# Patient Record
Sex: Female | Born: 1960 | Race: Black or African American | Hispanic: No | Marital: Single | Smoking: Former smoker
Health system: Southern US, Community
[De-identification: ages and names within clinical notes are randomized; demographics above are authoritative.]

## PROBLEM LIST (undated history)

## (undated) DIAGNOSIS — K219 Gastro-esophageal reflux disease without esophagitis: Secondary | ICD-10-CM

## (undated) DIAGNOSIS — M7660 Achilles tendinitis, unspecified leg: Secondary | ICD-10-CM

## (undated) DIAGNOSIS — E0590 Thyrotoxicosis, unspecified without thyrotoxic crisis or storm: Secondary | ICD-10-CM

## (undated) DIAGNOSIS — D219 Benign neoplasm of connective and other soft tissue, unspecified: Secondary | ICD-10-CM

## (undated) DIAGNOSIS — F909 Attention-deficit hyperactivity disorder, unspecified type: Secondary | ICD-10-CM

## (undated) DIAGNOSIS — E559 Vitamin D deficiency, unspecified: Secondary | ICD-10-CM

## (undated) DIAGNOSIS — F419 Anxiety disorder, unspecified: Secondary | ICD-10-CM

## (undated) DIAGNOSIS — N879 Dysplasia of cervix uteri, unspecified: Secondary | ICD-10-CM

## (undated) DIAGNOSIS — E7800 Pure hypercholesterolemia, unspecified: Secondary | ICD-10-CM

## (undated) HISTORY — DX: Anxiety disorder, unspecified: F41.9

## (undated) HISTORY — DX: Thyrotoxicosis, unspecified without thyrotoxic crisis or storm: E05.90

## (undated) HISTORY — PX: COLPOSCOPY: SHX161

## (undated) HISTORY — PX: KNEE SURGERY: SHX244

## (undated) HISTORY — PX: CERVICAL DISC SURGERY: SHX588

## (undated) HISTORY — PX: PELVIC LAPAROSCOPY: SHX162

## (undated) HISTORY — DX: Pure hypercholesterolemia, unspecified: E78.00

## (undated) HISTORY — DX: Benign neoplasm of connective and other soft tissue, unspecified: D21.9

## (undated) HISTORY — DX: Dysplasia of cervix uteri, unspecified: N87.9

## (undated) HISTORY — DX: Vitamin D deficiency, unspecified: E55.9

## (undated) HISTORY — DX: Achilles tendinitis, unspecified leg: M76.60

## (undated) HISTORY — PX: GYNECOLOGIC CRYOSURGERY: SHX857

---

## 2001-08-17 ENCOUNTER — Other Ambulatory Visit: Admission: RE | Admit: 2001-08-17 | Discharge: 2001-08-17 | Payer: Self-pay | Admitting: Obstetrics and Gynecology

## 2002-09-04 ENCOUNTER — Other Ambulatory Visit: Admission: RE | Admit: 2002-09-04 | Discharge: 2002-09-04 | Payer: Self-pay | Admitting: Obstetrics and Gynecology

## 2003-01-26 ENCOUNTER — Encounter: Admission: RE | Admit: 2003-01-26 | Discharge: 2003-01-26 | Payer: Self-pay | Admitting: Family Medicine

## 2003-01-26 ENCOUNTER — Encounter: Payer: Self-pay | Admitting: Family Medicine

## 2003-02-13 ENCOUNTER — Encounter: Payer: Self-pay | Admitting: Family Medicine

## 2003-02-13 ENCOUNTER — Encounter: Admission: RE | Admit: 2003-02-13 | Discharge: 2003-02-13 | Payer: Self-pay | Admitting: Family Medicine

## 2003-04-22 ENCOUNTER — Inpatient Hospital Stay (HOSPITAL_COMMUNITY): Admission: RE | Admit: 2003-04-22 | Discharge: 2003-04-23 | Payer: Self-pay | Admitting: Neurosurgery

## 2004-02-03 ENCOUNTER — Other Ambulatory Visit: Admission: RE | Admit: 2004-02-03 | Discharge: 2004-02-03 | Payer: Self-pay | Admitting: Obstetrics and Gynecology

## 2005-08-05 ENCOUNTER — Other Ambulatory Visit: Admission: RE | Admit: 2005-08-05 | Discharge: 2005-08-05 | Payer: Self-pay | Admitting: Obstetrics and Gynecology

## 2006-09-20 ENCOUNTER — Other Ambulatory Visit: Admission: RE | Admit: 2006-09-20 | Discharge: 2006-09-20 | Payer: Self-pay | Admitting: Obstetrics and Gynecology

## 2007-05-01 ENCOUNTER — Emergency Department (HOSPITAL_COMMUNITY): Admission: EM | Admit: 2007-05-01 | Discharge: 2007-05-01 | Payer: Self-pay | Admitting: Emergency Medicine

## 2007-06-08 HISTORY — PX: VAGINAL HYSTERECTOMY: SUR661

## 2007-06-16 ENCOUNTER — Ambulatory Visit (HOSPITAL_COMMUNITY): Admission: RE | Admit: 2007-06-16 | Discharge: 2007-06-17 | Payer: Self-pay | Admitting: Obstetrics and Gynecology

## 2007-06-16 ENCOUNTER — Encounter: Payer: Self-pay | Admitting: Obstetrics and Gynecology

## 2008-03-18 ENCOUNTER — Ambulatory Visit: Payer: Self-pay | Admitting: Obstetrics and Gynecology

## 2008-04-02 ENCOUNTER — Ambulatory Visit: Payer: Self-pay | Admitting: Obstetrics and Gynecology

## 2009-03-13 ENCOUNTER — Other Ambulatory Visit: Admission: RE | Admit: 2009-03-13 | Discharge: 2009-03-13 | Payer: Self-pay | Admitting: Obstetrics and Gynecology

## 2009-03-13 ENCOUNTER — Ambulatory Visit: Payer: Self-pay | Admitting: Obstetrics and Gynecology

## 2009-03-13 ENCOUNTER — Encounter: Payer: Self-pay | Admitting: Obstetrics and Gynecology

## 2009-10-28 ENCOUNTER — Emergency Department (HOSPITAL_BASED_OUTPATIENT_CLINIC_OR_DEPARTMENT_OTHER): Admission: EM | Admit: 2009-10-28 | Discharge: 2009-10-28 | Payer: Self-pay | Admitting: Emergency Medicine

## 2009-10-28 ENCOUNTER — Ambulatory Visit: Payer: Self-pay | Admitting: Diagnostic Radiology

## 2010-10-20 NOTE — H&P (Signed)
NAME:  Janet Kelley, Janet Kelley             ACCOUNT NO.:  [id number]   MEDICAL RECORD NO.:  [id number]          PATIENT TYPE:  AMB   LOCATION:  SDC                           FACILITY:  [facility]   PHYSICIAN:  Daniel L. Gottsegen, M.D.DATE OF BIRTH:  Jun 03, 1961   DATE OF ADMISSION:  06/16/2007  DATE OF DISCHARGE:                              HISTORY & PHYSICAL   CHIEF COMPLAINT:  Dysmenorrhea, menorrhagia, multiple myomas including  submucous myoma.   HISTORY OF PRESENT ILLNESS:  The patient is a 50 year old nulligravida  who has been a patient in our office for a long time.  She had  previously been treated by me for endometriosis with laser of her  endometriosis and did get some good results with that.  She then started  to have increasing dysmenorrhea and menorrhagia.  Initially this  responded to a Mirena IUD.  However, it started to become uncontrollable  even with her Mirena IUD.  She would get periods that would last 10  days.  They would be heavy on some of the days.  She would get severe  dysmenorrhea.  She came in in October of this year and underwent  ultrasound.  Ultrasound showed two fibroids, one that was 4 cm and one  that was 2 cm.  She did have a small complex cyst on her left ovary.  Her IUD was removed and saline infusion histogram only showed a  submucous myoma, but the cavity itself was normal without any  intrauterine pathology.  Options discussed with her including uterine  artery embolization, endometrial ablation, but because of the pathology  as well as her history of endometriosis we have elected to proceed with  laparoscopic-assisted vaginal hysterectomy.  The patient is aware of the  fact that it may be difficult to remove her cervix and therefore we plan  to do a supracervical if that is the case so that we will have to make  an incision.  She knows that there is certainly some chance that she  would need an incision anyway, but hopefully that is small.  She has  given  me permission to remove one ovary for significant disease, two  ovaries only for malignancy.  We will treat any minimal endometriosis  seen without removing her ovaries.   PAST MEDICAL HISTORY:  1. History of endometriosis with laser of it in 1995.  2. Surgery for cervical disk November 2004.   PRESENT MEDICATIONS:  Zyrtec for allergies, Celexa, Wellbutrin, Ambien  controlled release and clorazepate.  Please see medical reconciliation  she for the doses.   ALLERGIES:  CECLOR.   Past medical history is otherwise unremarkable.   PAST SURGERIES:  As noted above.   FAMILY HISTORY:  Mother and paternal uncle have had coronary artery  disease.  Mother is diabetic.  Mother and sister have had hypertension.  Paternal grandmother had colon cancer.   SOCIAL HISTORY:  She smokes half a pack of cigarettes a day.  She uses  caffeine.  Other than that is unremarkable.   REVIEW OF SYSTEMS:  HEENT:  Negative except for sinus trouble.  CARDIOVASCULAR:  Negative.  GI: Negative.  GU: Negative.  NEUROLOGICAL/PSYCHIATRIC:  Negative except as noted with her disk  surgery. ALLERGIC/IMMUNOLOGICAL/LYMPHATIC/ENDOCRINE:  Positive as noted  above.   PHYSICAL EXAMINATION:  GENERAL APPEARANCE:  The patient is a well-  developed, well-nourished female in no acute distress.  VITAL SIGNS:  Her blood pressure is 124/74, pulse is 80 and regular,  respirations 16 nonlabored.  She is afebrile.  HEENT:  All within normal limits.  NECK:  Supple.  Trachea in the midline.  Thyroid is not enlarged.  LUNGS:  Clear to P&A.  HEART:  No thrills, heaves or murmurs.  BREASTS:  No masses.  ABDOMEN:  Soft without guarding, rebound or masses.  PELVIC:  External is normal.  BUS is normal.  Vagina is normal.  Cervix  is clean.  Uterus is enlarged by small myomas to a 7 to 8 week size.  Adnexa fails to reveal masses.  Rectovaginal is confirmatory.   ADMISSION IMPRESSION:  Severe menorrhagia, severe dysmenorrhea,  submucous  and another myomas, history of endometriosis.   PLAN:  LAVH with supracervical as back up.      Daniel L. Eda Paschal, M.D.  Electronically Signed     DLG/MEDQ  D:  06/15/2007  T:  06/15/2007  Job:  [id number]

## 2010-10-20 NOTE — Op Note (Signed)
NAME:  Janet Kelley, Janet Kelley             ACCOUNT NO.:  [id number]   MEDICAL RECORD NO.:  [id number]          PATIENT TYPE:  OIB   LOCATION:  9306                          FACILITY:  [facility]   PHYSICIAN:  Daniel L. Gottsegen, M.D.DATE OF BIRTH:  09-12-1960   DATE OF PROCEDURE:  06/16/2007  DATE OF DISCHARGE:                               OPERATIVE REPORT   PREOPERATIVE DIAGNOSES:  1. Dysmenorrhea.  2. Menorrhagia.  3. Fibroids.  4. Endometriosis.   POSTOPERATIVE DIAGNOSES:  1. Dysmenorrhea.  2. Menorrhagia.  3. Fibroids.  4. No endometriosis seen.   OPERATIONS:  Laparoscopic-assisted vaginal hysterectomy.   SURGEON:  Daniel L. Eda Paschal, M.D.   FIRST ASSISTANT:  Miguel Aschoff, M.D.   FINDINGS AT SURGERY:  The patient had multiple fibroids with one very  large subserosal one of about 5 cm.  Total size of the uterus was  slightly over 200 grams.  Ovaries, fallopian tubes and pelvic peritoneum  were all free of disease and no endometriosis was seen.  No pelvic  adhesive disease was seen either.   PROCEDURE:  After adequate general endotracheal anesthesia, the patient  was placed in the dorsal lithotomy position, prepped and draped in the  usual sterile manner.  A Hulka catheter was inserted into the uterus and  a Foley catheter was placed in the bladder.  Using the OptiVu through a  subumbilical midline incision, direct insertion of the trocar and the  laparoscope was placed.  A diagnostic scope was placed with a camera for  magnification.  The peritoneal cavity was entered without trauma.  Two 5  mm ports were placed in the pelvis in the right and left lower quadrant.  The pelvis was visualized and was as noted above.  The round ligaments  were bipolared and cut.  The vesicouterine fold of peritoneum was  sharply dissected free.  The Gyrus was used for all the above.  The  utero-ovarian ligaments and fallopian tubes were bipolared and cut.  The  bladder flap was advanced.  The uterine  arteries were bipolared and cut.  The surgeons then went vaginally.  There was some technical problems  because the patient was nulligravida and also had a very android pelvis.  A 1:200,000 solution of epinephrine was injected around the cervix and  then a 360 degree incision was made around the cervix, bladder and  posterior peritoneum were advanced without trauma.  The posterior cul-de-  sac was entered.  The uterosacral and cardinal ligaments were clamped.  In clamping them, the uterosacrals were shortened and then they were  sutured to the vault laterally for good vault support.  At this point,  the vesicouterine fold of peritoneum could be atraumatically entered.  Another bite was taken on each side.  The uterus was then partially  delivered and with a combination of both morcellation and myomectomy,  the size of the uterus could be reduced enough that the uterus could be  delivered and the balance of the broad ligament bilaterally was clamped,  cut and suture ligated.  The uterus was sent to pathology in pieces for  tissue diagnosis.  Suture  material for all the above-mentioned pedicles  was #1 chromic catgut.  After this was done, copious irrigation was done  with sterile saline.  The vaginal cuff was sutured to the peritoneum  with a running locking 0 Vicryl and then the cuff was closed with figure-  of-eights of #1 chromic catgut eliminating the cul-de-sac to prevent a  enterocele.  The surgeons then regloved, went above.  Copious irrigation  was done, there was no bleeding noted, therefore the procedure was  terminated.  All trocars were removed.  The subumbilical fascial  incision was closed with 0 Vicryl.  The two lower incisions were closed  with 4-0 Monocryl and Dermabond was placed on the subumbilical incision.  Estimated blood loss for the entire procedure was 250 mL with none  replaced.  The patient tolerated procedure well and left the operating  room in satisfactory  condition, draining clear urine from her Foley  catheter.      Daniel L. Eda Paschal, M.D.  Electronically Signed     DLG/MEDQ  D:  06/16/2007  T:  06/16/2007  Job:  [id number]

## 2010-10-20 NOTE — Discharge Summary (Signed)
NAME:  Janet Kelley, Janet Kelley             ACCOUNT NO.:  [id number]   MEDICAL RECORD NO.:  [id number]          PATIENT TYPE:  OIB   LOCATION:  9306                          FACILITY:  [facility]   PHYSICIAN:  Daniel L. Gottsegen, M.D.DATE OF BIRTH:  1960-12-28   DATE OF ADMISSION:  06/16/2007  DATE OF DISCHARGE:  06/17/2007                               DISCHARGE SUMMARY   HISTORY:  The patient is a 50 year old nulligravida who was admitted to  the hospital with fibroids, dysmenorrhea, menorrhagia and pelvic pain  for definitive surgery.  On the day of admission she was taken to the  operating room and a laparoscopic assisted vaginal hysterectomy was done  without difficulty.  She was kept overnight for observation without  being admitted and on the morning of January 10 she was ready for  discharge.  She was voiding well, tolerating normal diet and pain relief  was adequate.   DISCHARGE MEDICATIONS:  Were Tylox for pain relief.  She also has  Darvocet-N 100 which she will reduce to as needed.   DISCHARGE INSTRUCTIONS:  She will increase her diet slowly.  She was  allowed to take steps but she will not drive for 2 weeks or lift for 4  weeks.   FOLLOWUP:  She will return to the office in 3 weeks and will call for an  appointment.  Final pathology report is not available at time of  dictation.   DISCHARGE DIAGNOSIS:  Leiomyomata uteri with pelvic pain, dysmenorrhea  and menorrhagia.   OPERATION:  Laparoscopic assisted vaginal hysterectomy.   CONDITION ON DISCHARGE:  improved      Daniel L. Eda Paschal, M.D.  Electronically Signed     DLG/MEDQ  D:  06/17/2007  T:  06/17/2007  Job:  [id number]

## 2010-10-23 NOTE — Op Note (Signed)
   NAME:  Janet Kelley, Janet Kelley                       ACCOUNT NO.:  [id number]   MEDICAL RECORD NO.:  [id number]                   PATIENT TYPE:  INP   LOCATION:  2887                                 FACILITY:  [facility]   PHYSICIAN:  Hewitt Shorts, Kelley.D.            DATE OF BIRTH:  08/14/1960   DATE OF PROCEDURE:  DATE OF DISCHARGE:                                 OPERATIVE REPORT   NO DICTATION.                                               Hewitt Shorts, Kelley.D.    RWN/MEDQ  D:  04/22/2003  T:  04/22/2003  Job:  [id number]

## 2010-10-23 NOTE — Op Note (Signed)
NAME:  Janet Kelley, Janet Kelley                       ACCOUNT NO.:  [id number]   MEDICAL RECORD NO.:  [id number]                   PATIENT TYPE:  INP   LOCATION:  2887                                 FACILITY:  [facility]   PHYSICIAN:  Hewitt Shorts, Kelley.D.            DATE OF BIRTH:  1960/12/04   DATE OF PROCEDURE:  04/22/2003  DATE OF DISCHARGE:                                 OPERATIVE REPORT   PREOPERATIVE DIAGNOSES:  C4-5 and C5-6 cervical disk herniation, cervical  spondylosis, cervical degenerative disk disease and cervical radiculopathy.   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   PROCEDURE:  C4-5 and C5-6 anterior cervical diskectomy and arthrodesis with  iliac crest allograft and Trinica cervical plating.   SURGEON:  Hewitt Shorts, Kelley.D.   ASSISTANT:  Coletta Memos, Kelley.D.   ANESTHESIA:  General endotracheal.   INDICATIONS FOR PROCEDURE:  The patient is a 50 year old woman who presented  with a left cervical radiculopathy, was found to have a left C5-6 cervical  disk herniation and left C4-5 spiraling spur.  Decision was made to proceed  with anterior cervical diskectomy and arthrodesis.   DESCRIPTION OF PROCEDURE:  The patient was brought to the operating room,  placed under general endotracheal anesthesia.  Patient was placed on 10  pounds of Halter traction.  The neck was prepped with Betadine soap and  solution, draped in a sterile fashion.  A horizontal incision was made in  the left side of the neck.  The line of incision was infiltrated with local  anesthetic with epinephrine.  Incision was deepened through subcutaneous  tissues.  Bipolar cautery and electrocautery were used to maintain  hemostasis.  Dissection was carried down through an avascular plane to the  cervical, mastoid, carotid, jugular laterally and trachea and esophagus  medially, and the  ventral aspect of the vertebral column was identified.  A  localizing x-ray was taken and the C4-5 and C5-6 intervertebral disk space  was identified.   Diskectomy was begun with incision in the annulus and continued with the  micro-curets and pituitary rongeurs.  The caudal edges of the endplates of  the corresponding vertebrae were removed using the micro-curets as well as  the micromax drill.  Then the microscope was draped and brought into the  field to provide instrument magnification, illumination and visualization  and the remainder of the decompression was performed using microdissection  and microsurgical technique.  Spiny overgrowth posteriorly was removed  posteriorly using the Micro-max drill, along with the 2 mm Kerrison punches  and footplate.  Posterior longitudinal ligament was removed.  At each level,  it was thickened no acute distress partially calcified.  The disk herniation  was removed, and we were able to decompress the spinal canal and thecal sac,  as well as the foramina and nerve roots bilaterally at each level.  Once the decompression was completed, hemostasis was established with the  use of Gelfoam soaked in thrombin, and then we proceeded with the  arthrodesis. We selected wedge of tricortical iliac crest allograft.  Each  was shaped to the proper size. We sized the intervertebral disk space with  spacers, and then each was grasped into position in the intervertebral disk  space and countersunk.  We then selected at 42 mm Trinica cervical plate.  This was positioned over the fusion construct and secured to the vertebrae  at C4 and C6 with a pair of 4-plate 2 x 14 mm screws. At C4, we used one 4 x  14 mm screw and one 4.2 x 16 mm screw.  All 6 screws were fully tightened  and the locking system secured. The wound was irrigated with Bacitracin  solution, checked for hemostasis with Valsalva confirmed.  Then we proceeded  with closure. The platysma was closed  with interrupted inverted 2-0 Vicryl  sutures, subcutaneous and subcuticular layered closure with inverted 3-0  Vicryl interrupted Vicryl sutures.  Skin was approximated with Dermabond.  The patient tolerated the procedure well.  Estimated blood loss was 50 cc.  Sponge and needle counts were correct.                                               Hewitt Shorts, Kelley.D.    RWN/MEDQ  D:  04/22/2003  T:  04/22/2003  Job:  [id number]

## 2010-12-17 ENCOUNTER — Encounter (INDEPENDENT_AMBULATORY_CARE_PROVIDER_SITE_OTHER): Payer: 59 | Admitting: Obstetrics and Gynecology

## 2010-12-17 ENCOUNTER — Other Ambulatory Visit (HOSPITAL_COMMUNITY)
Admission: RE | Admit: 2010-12-17 | Discharge: 2010-12-17 | Disposition: A | Discharge status: Home / Self Care | Payer: 59 | Source: Ambulatory Visit | Attending: Obstetrics and Gynecology | Admitting: Obstetrics and Gynecology

## 2010-12-17 ENCOUNTER — Other Ambulatory Visit: Payer: Self-pay | Admitting: Obstetrics and Gynecology

## 2010-12-17 DIAGNOSIS — R82998 Other abnormal findings in urine: Secondary | ICD-10-CM

## 2010-12-17 DIAGNOSIS — Z01419 Encounter for gynecological examination (general) (routine) without abnormal findings: Secondary | ICD-10-CM

## 2010-12-17 DIAGNOSIS — Z124 Encounter for screening for malignant neoplasm of cervix: Secondary | ICD-10-CM | POA: Insufficient documentation

## 2010-12-17 DIAGNOSIS — Z113 Encounter for screening for infections with a predominantly sexual mode of transmission: Secondary | ICD-10-CM

## 2010-12-22 ENCOUNTER — Encounter (INDEPENDENT_AMBULATORY_CARE_PROVIDER_SITE_OTHER): Payer: 59

## 2010-12-22 DIAGNOSIS — Z13820 Encounter for screening for osteoporosis: Secondary | ICD-10-CM

## 2011-02-04 ENCOUNTER — Other Ambulatory Visit: Payer: Self-pay | Admitting: Gastroenterology

## 2011-02-22 ENCOUNTER — Ambulatory Visit: Payer: 59 | Admitting: Gynecology

## 2011-02-23 ENCOUNTER — Encounter: Payer: Self-pay | Admitting: Gynecology

## 2011-02-23 ENCOUNTER — Ambulatory Visit (INDEPENDENT_AMBULATORY_CARE_PROVIDER_SITE_OTHER): Payer: 59 | Admitting: Gynecology

## 2011-02-23 DIAGNOSIS — B373 Candidiasis of vulva and vagina: Secondary | ICD-10-CM

## 2011-02-23 DIAGNOSIS — N898 Other specified noninflammatory disorders of vagina: Secondary | ICD-10-CM

## 2011-02-23 DIAGNOSIS — A5901 Trichomonal vulvovaginitis: Secondary | ICD-10-CM

## 2011-02-23 DIAGNOSIS — Z113 Encounter for screening for infections with a predominantly sexual mode of transmission: Secondary | ICD-10-CM

## 2011-02-23 DIAGNOSIS — B3731 Acute candidiasis of vulva and vagina: Secondary | ICD-10-CM

## 2011-02-23 MED ORDER — FLUCONAZOLE 150 MG PO TABS: 150.0000 mg | ORAL_TABLET | Freq: Once | ORAL | Status: AC

## 2011-02-23 MED ORDER — METRONIDAZOLE 500 MG PO TABS: 500.0000 mg | ORAL_TABLET | Freq: Once | ORAL | Status: AC

## 2011-02-23 NOTE — Progress Notes (Signed)
Patient returns one-week history of vaginal discharge no odor itching or other symptoms. She asked about being screened for STDs although last month she was screened by Dr. Eda Paschal.  Exam Pelvic: External BUS vagina with frothy white discharge KOH wet prep done GC Chlamydia screen of cough and urethra done bimanual without masses or tenderness  Assessment and plan: Wet prep positive for yeast and Trichomonas. We'll treat with Diflucan 150x1 dose Flagyl 500 mg #4 by mouth one time. Discussed need for her partner to be evaluated and treated the STD nature of trichomonas was reviewed. She'll follow up on her GC and Chlamydia screen also.

## 2011-02-25 LAB — CBC
HCT: 28.8 — ABNORMAL LOW
HCT: 36.8
Hemoglobin: 10 — ABNORMAL LOW
Hemoglobin: 12.5
MCHC: 33.9
MCHC: 34.6
MCV: 91.3
MCV: 91.5
Platelets: 259
Platelets: 327
RBC: 3.15 — ABNORMAL LOW
RBC: 4.02
RDW: 13
RDW: 13.4
WBC: 5.5
WBC: 8.1

## 2011-02-25 LAB — HCG, SERUM, QUALITATIVE: Preg, Serum: NEGATIVE

## 2011-02-25 LAB — URINALYSIS, DIPSTICK ONLY
Bilirubin Urine: NEGATIVE
Glucose, UA: NEGATIVE
Hgb urine dipstick: NEGATIVE
Ketones, ur: NEGATIVE
Leukocytes, UA: NEGATIVE
Nitrite: NEGATIVE
Protein, ur: NEGATIVE
Specific Gravity, Urine: 1.015
Urobilinogen, UA: 0.2
pH: 6

## 2011-02-25 LAB — BASIC METABOLIC PANEL
BUN: 7
CO2: 28
Calcium: 9.4
Chloride: 101
Creatinine, Ser: 0.65
GFR calc Af Amer: >60
GFR calc non Af Amer: >60
Glucose, Bld: 151 — ABNORMAL HIGH
Potassium: 3.5
Sodium: 135

## 2011-02-26 ENCOUNTER — Telehealth: Payer: Self-pay | Admitting: *Deleted

## 2011-02-26 NOTE — Telephone Encounter (Signed)
PT CALLED WANTING STD RESULTS. RESULTS GIVEN TO PT.

## 2011-03-16 LAB — POCT I-STAT CREATININE
Creatinine, Ser: 0.9
Operator id: 198171

## 2011-03-16 LAB — POCT CARDIAC MARKERS
CKMB, poc: <1 — ABNORMAL LOW
Myoglobin, poc: 75.4
Operator id: 198171
Troponin i, poc: <0.05

## 2011-03-16 LAB — I-STAT 8, (EC8 V) (CONVERTED LAB)
Acid-base deficit: 3 — ABNORMAL HIGH
BUN: 4 — ABNORMAL LOW
Bicarbonate: 21.9
Chloride: 106
Glucose, Bld: 184 — ABNORMAL HIGH
HCT: 44
Hemoglobin: 15
Operator id: 198171
Potassium: 3.6
Sodium: 136
TCO2: 23
pCO2, Ven: 37.1 — ABNORMAL LOW
pH, Ven: 7.378 — ABNORMAL HIGH

## 2011-03-23 ENCOUNTER — Other Ambulatory Visit: Payer: Self-pay | Admitting: Gastroenterology

## 2011-04-01 ENCOUNTER — Ambulatory Visit
Admission: RE | Admit: 2011-04-01 | Discharge: 2011-04-01 | Disposition: A | Discharge status: Home / Self Care | Payer: 59 | Source: Ambulatory Visit | Attending: Gastroenterology | Admitting: Gastroenterology

## 2011-09-01 ENCOUNTER — Encounter: Payer: Self-pay | Admitting: Cardiovascular Disease

## 2011-09-01 ENCOUNTER — Ambulatory Visit (INDEPENDENT_AMBULATORY_CARE_PROVIDER_SITE_OTHER): Payer: 59 | Admitting: Cardiovascular Disease

## 2011-09-01 VITALS — BP 133/86 | HR 82 | Ht 62.5 in | Wt 180.8 lb

## 2011-09-01 DIAGNOSIS — I498 Other specified cardiac arrhythmias: Secondary | ICD-10-CM

## 2011-09-01 DIAGNOSIS — I471 Supraventricular tachycardia: Secondary | ICD-10-CM | POA: Insufficient documentation

## 2011-09-01 MED ORDER — PROPRANOLOL HCL 10 MG PO TABS: 10.0000 mg | ORAL_TABLET | Freq: Four times a day (QID) | ORAL | Status: AC | PRN

## 2011-09-01 NOTE — Progress Notes (Signed)
    Janet Kelley Date of Birth  11-May-1961 [facility]     [place] Office  [address]    [address]   [address] [place], [place]  [address]    [place], [place]  [address] [phone]  Fax  [phone]  [phone]  Fax [phone]   Problem list: 1. Supraventricular tachycardia 2. Diabetes mellitus 3. Hyperlipidemia  History of Present Illness:  Littie is a 51 year old female with a history of supraventricular tachycardia. I seen her in years past. She has not had any recent palpitations. She presents today to reestablish care.  She's not had any recent episodes of palpitations.  She quit smoking 4 years ago. She exercises on an irregular basis. 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  Current Outpatient Prescriptions  Medication Sig Dispense Refill  . citalopram (CELEXA) 20 MG tablet Take 20 mg by mouth daily.       . clonazePAM (KLONOPIN) 0.5 MG tablet Take 0.5 mg by mouth daily.       . fexofenadine (ALLEGRA) 180 MG tablet Take 180 mg by mouth daily.      Marland Kitchen JANUMET 50-500 MG per tablet Take 1 tablet by mouth daily.       . rosuvastatin (CRESTOR) 20 MG tablet Take 20 mg by mouth daily.         Allergies  Allergen Reactions  . Ceclor (Cefaclor)     Past Medical History  Diagnosis Date  . Endometriosis   . Fibroid     Past Surgical History  Procedure Date  . Vaginal hysterectomy   . Pelvic laparoscopy     History  Smoking status  . Former Smoker  Smokeless tobacco  . Not on file    History  Alcohol Use  . Yes    No family history on file.  Reviw of Systems:  Reviewed in the HPI.  All other systems are negative.  Physical Exam: Blood pressure 133/86, pulse 82, height 5' 2.5" (1.588 m), weight 180 lb 12.8 oz (82.01 kg). General: Well developed, well nourished, in no acute distress.  Head: Normocephalic, atraumatic, sclera non-icteric, mucus membranes are moist,   Neck: Supple. Carotids are 2 + without bruits.  No JVD  Lungs: Clear bilaterally to auscultation.  Heart: regular rate.  normal  S1 S2. No murmurs, gallops or rubs.  Abdomen: Soft, non-tender, non-distended with normal bowel sounds. No hepatomegaly. No rebound/guarding. No masses.  Msk:  Strength and tone are normal  Extremities: No clubbing or cyanosis. No edema.  Distal pedal pulses are 2+ and equal bilaterally.  Neuro: Alert and oriented X 3. Moves all extremities spontaneously.  Psych:  Responds to questions appropriately with a normal affect.  ECG: NSR. Incomplete RBBB.  Assessment / Plan:

## 2011-09-01 NOTE — Patient Instructions (Signed)
Your physician wants you to follow-up in: 1 year or sooner if need, You will receive a reminder letter in the mail two months in advance. If you don't receive a letter, please call our office to schedule the follow-up appointment.   

## 2011-09-01 NOTE — Assessment & Plan Note (Signed)
Janet Kelley has a history of supraventricular tachycardia. She has not had to take any propranolol for quite some time but we will refill her prescription. I'll see her again in one year for followup visit. I have ester, sooner if she has any problems.  I've asked her to work on a good diet and exercise program in an effort to lose weight.

## 2011-12-21 ENCOUNTER — Encounter: Payer: Self-pay | Admitting: Obstetrics and Gynecology

## 2012-01-06 IMAGING — US US ABDOMEN COMPLETE
1 series · 14 of 25 positions shown · non-contrast
Comparison: None.

CLINICAL DATA: LUQ abdominal pain

COMPLETE ABDOMINAL ULTRASOUND

[Series 1: us abdomen complete · 0.37mm/px · 14 of 70 slices shown]
[im 1/70]
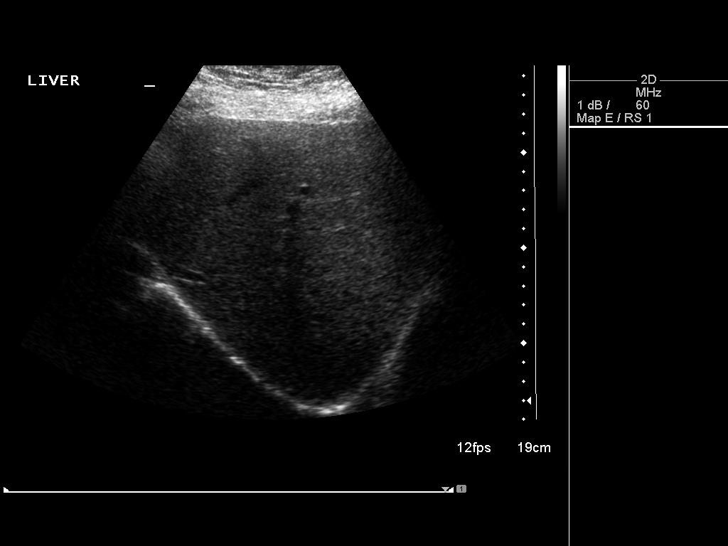
[im 6/70]
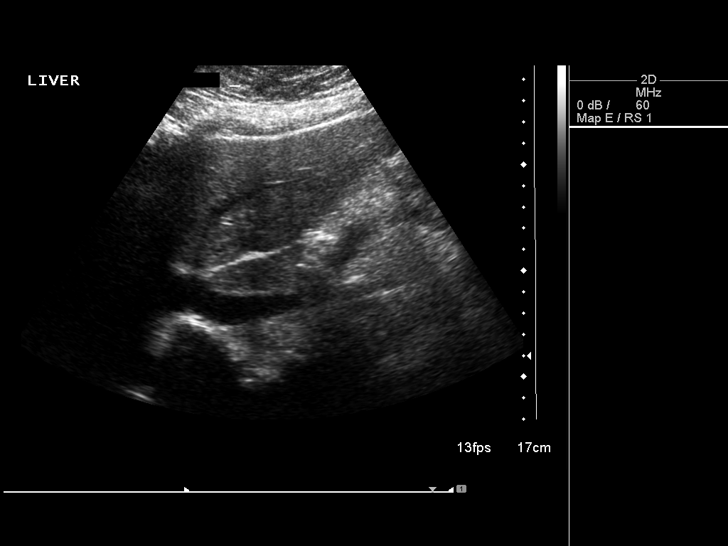
[im 12/70]
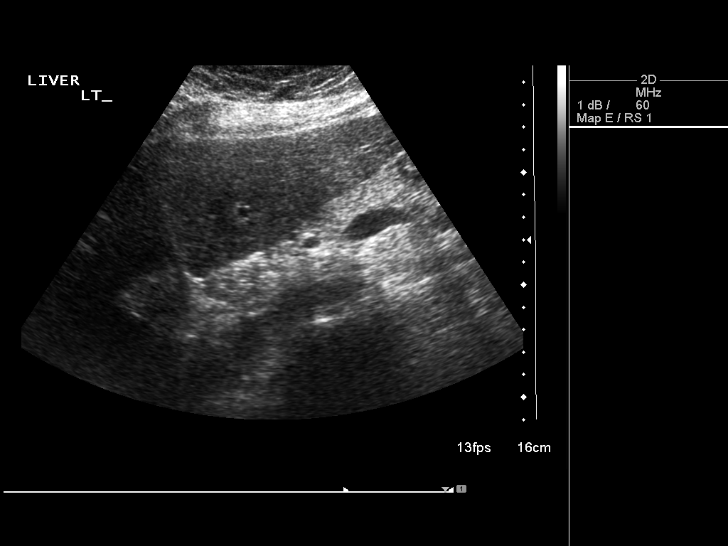
[im 18/70]
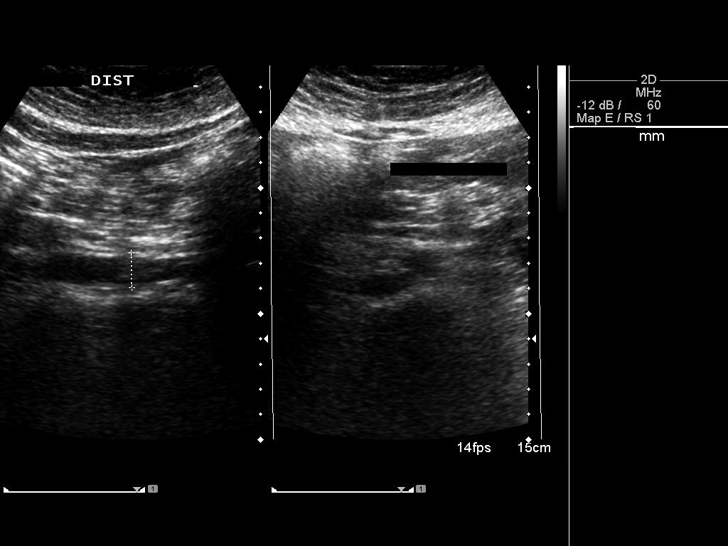
[im 24/70]
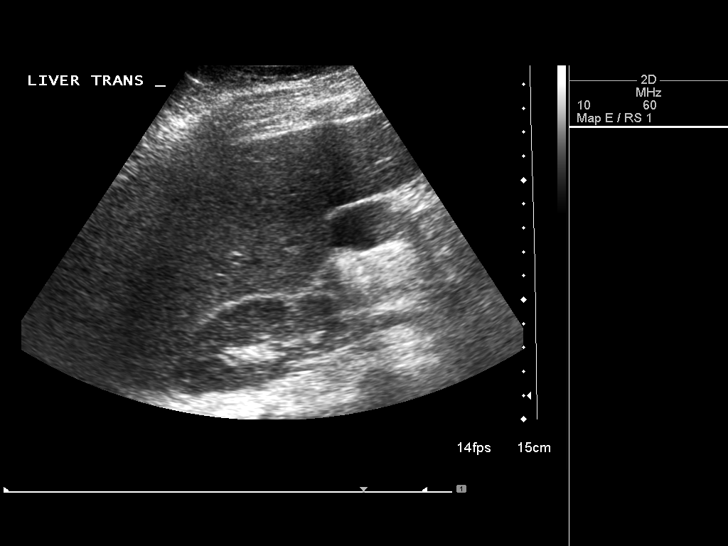
[im 26/70]
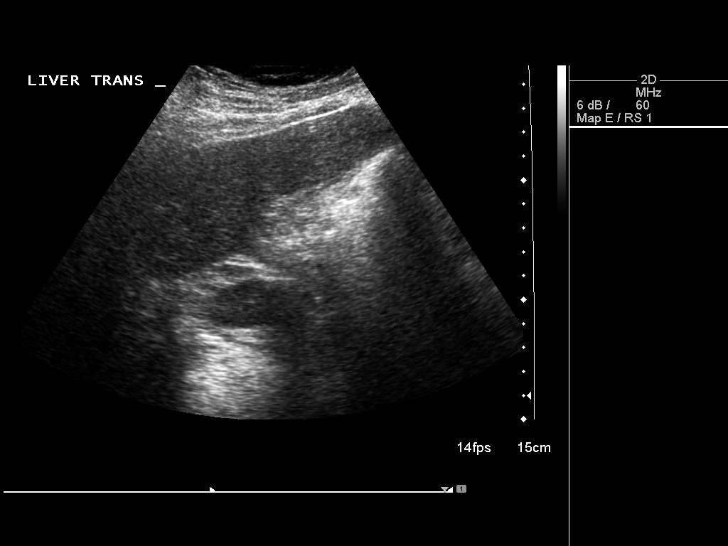
[im 32/70]
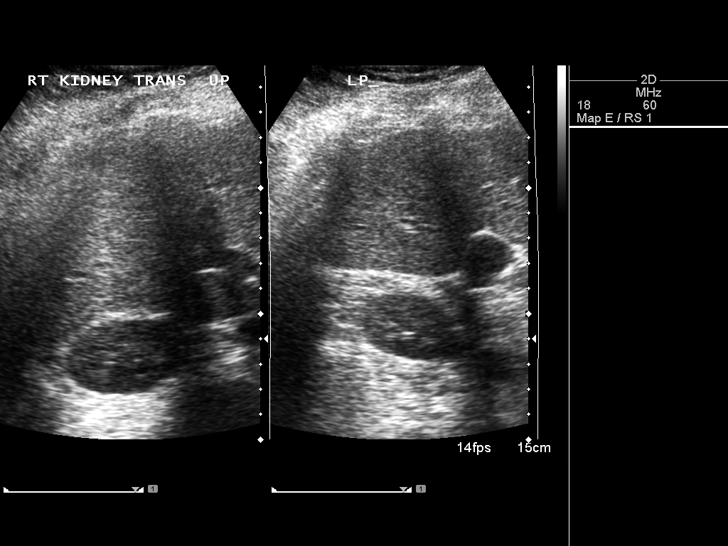
[im 38/70]
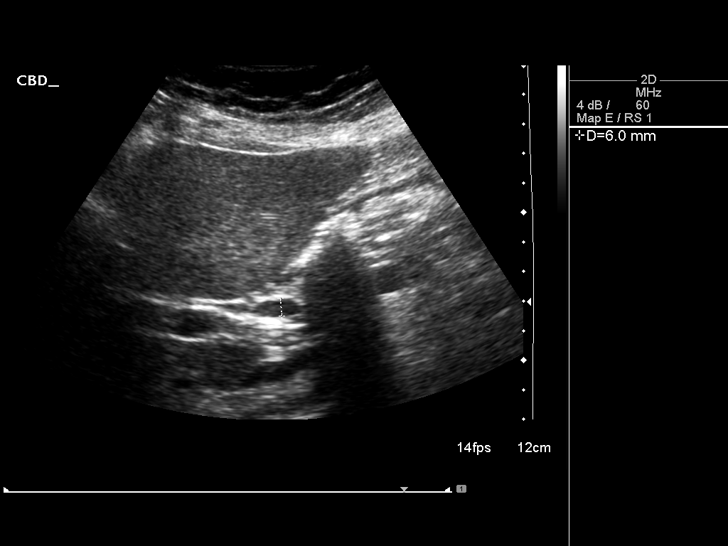
[im 44/70]
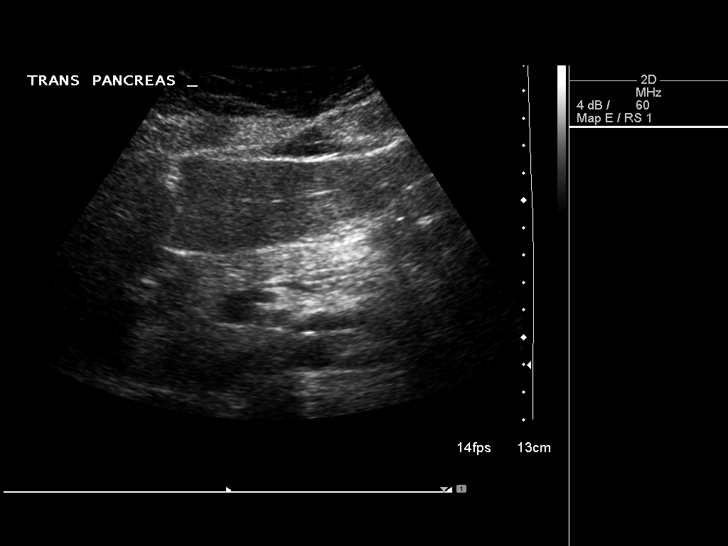
[im 47/70]
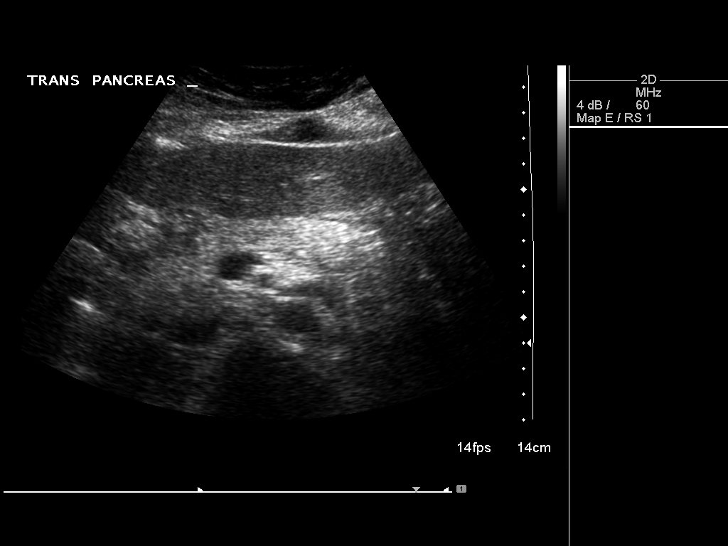
[im 52/70]
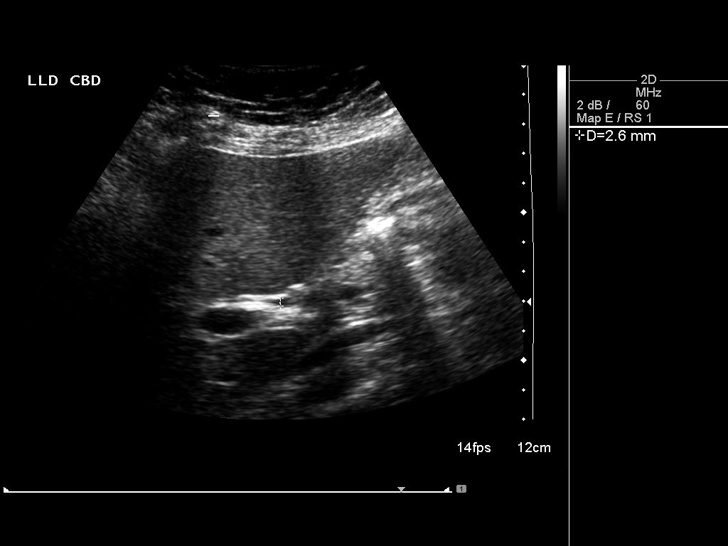
[im 58/70]
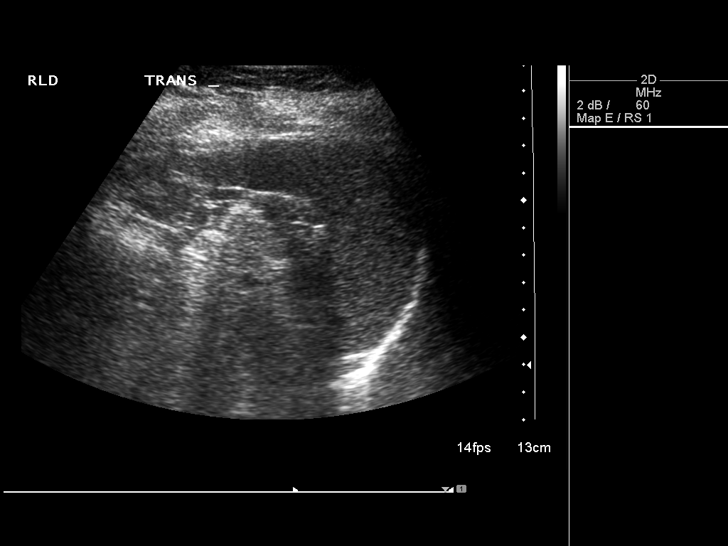
[im 64/70]
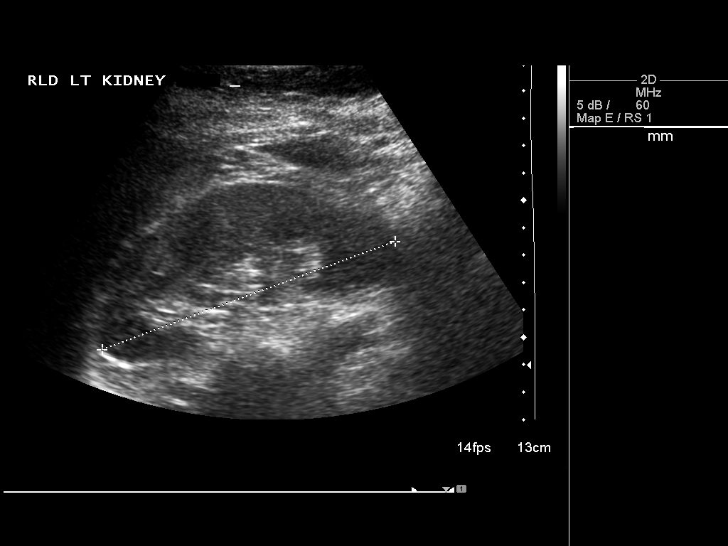
[im 70/70]
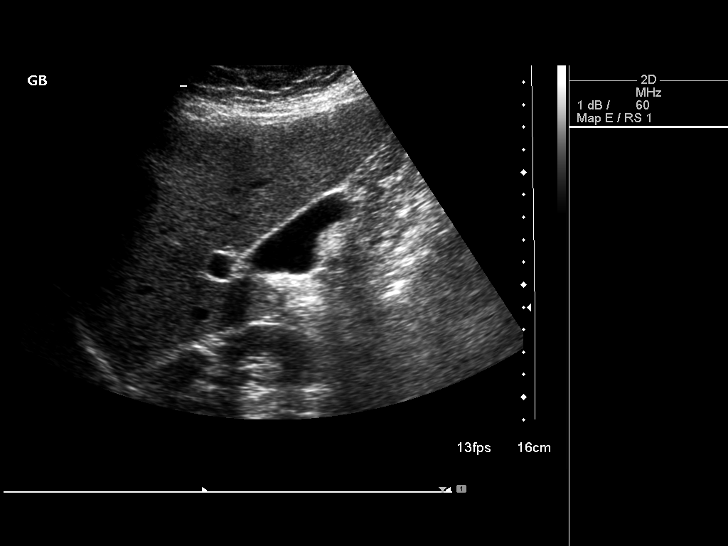

[14 of 25 positions shown; findings below may reference images not displayed]

FINDINGS: Gallbladder:  No gallstones, gallbladder wall thickening, or
pericholecystic fluid.

Common bile duct:  Measures 6 mm.

Liver:  No focal lesion identified.  Within normal limits in
parenchymal echogenicity.

IVC:  Appears normal.

Pancreas:  Incompletely visualized but grossly unremarkable.

Spleen:  Measures 6.9 cm.

Right Kidney:  Measures 9.5 cm.  No mass or hydronephrosis.

Left Kidney:  Measures 11.4 cm.  No mass or hydronephrosis.

Abdominal aorta:  No aneurysm identified.
IMPRESSION: Negative abdominal ultrasound.

## 2012-01-12 ENCOUNTER — Encounter: Payer: Self-pay | Admitting: Gynecology

## 2012-01-12 DIAGNOSIS — D219 Benign neoplasm of connective and other soft tissue, unspecified: Secondary | ICD-10-CM | POA: Insufficient documentation

## 2012-01-12 DIAGNOSIS — N809 Endometriosis, unspecified: Secondary | ICD-10-CM | POA: Insufficient documentation

## 2012-01-25 ENCOUNTER — Encounter: Payer: Self-pay | Admitting: Obstetrics and Gynecology

## 2012-01-25 ENCOUNTER — Ambulatory Visit (INDEPENDENT_AMBULATORY_CARE_PROVIDER_SITE_OTHER): Payer: 59 | Admitting: Obstetrics and Gynecology

## 2012-01-25 VITALS — BP 124/78 | Ht 62.5 in | Wt 183.0 lb

## 2012-01-25 DIAGNOSIS — F419 Anxiety disorder, unspecified: Secondary | ICD-10-CM | POA: Insufficient documentation

## 2012-01-25 DIAGNOSIS — Z01419 Encounter for gynecological examination (general) (routine) without abnormal findings: Secondary | ICD-10-CM

## 2012-01-25 DIAGNOSIS — N879 Dysplasia of cervix uteri, unspecified: Secondary | ICD-10-CM | POA: Insufficient documentation

## 2012-01-25 DIAGNOSIS — E7800 Pure hypercholesterolemia, unspecified: Secondary | ICD-10-CM | POA: Insufficient documentation

## 2012-01-25 DIAGNOSIS — Z113 Encounter for screening for infections with a predominantly sexual mode of transmission: Secondary | ICD-10-CM

## 2012-01-25 LAB — HIV ANTIBODY (ROUTINE TESTING W REFLEX): HIV: NONREACTIVE

## 2012-01-25 LAB — HEPATITIS C ANTIBODY: HCV Ab: NEGATIVE

## 2012-01-25 LAB — RPR

## 2012-01-25 LAB — HEPATITIS B SURFACE ANTIGEN: Hepatitis B Surface Ag: NEGATIVE

## 2012-01-25 MED ORDER — ESTRADIOL 1 MG PO TABS: 1.0000 mg | ORAL_TABLET | Freq: Every day | ORAL | Status: DC

## 2012-01-25 NOTE — Progress Notes (Signed)
Patient came to see me today for her annual GYN exam. In 2010 we told her she was menopausal. She did well until this year when she started having hot flashes that are now intolerable and vaginal dryness. She is having no vaginal bleeding. She is having no pelvic pain. She is status post LAVH done for endometriosis and fibroids. She was treated with cryosurgery for cervical dysplasia but has had normal Pap smears for greater than 20 years. Her last Pap smear was 2012. She is up-to-date on mammograms. She does her lab through PCP. She had normal bone density at our office in 2012. She is not currently sexually active but was a month ago and requested STD testing.  HEENT: Within normal limits. Janet Kelley present. Neck: No masses. Supraclavicular lymph nodes: Not enlarged. Breasts: Examined in both sitting and lying position. Symmetrical without skin changes or masses. Abdomen: Soft no masses guarding or rebound. No hernias. Pelvic: External within normal limits. BUS within normal limits. Vaginal examination shows good estrogen effect, no cystocele enterocele or rectocele. Cervix and uterus absent. Adnexa within normal limits. Rectovaginal confirmatory. Extremities within normal limits.  Assessment: #1. Vasomotor symptoms #2. Endometriosis #3. Previous history of cervical dysplasia with cryosurgery with normal Pap for greater than 20 years. The new Pap smear guidelines were discussed with the patient. No Pap done.  Plan: Discussed HRT in detail. Discussed transdermal estrogen. Patient understands that transdermal is safer for DVT. She elected to do oral estradiol 1 mg daily. Continue yearly mammograms. Patient checked  for STD at her request.

## 2012-01-25 NOTE — Patient Instructions (Signed)
Continue yearly mammograms 

## 2012-01-25 NOTE — Addendum Note (Signed)
Addended by: Dayna Barker on: 01/25/2012 09:39 AM   Modules accepted: Orders

## 2012-01-26 LAB — URINALYSIS W MICROSCOPIC + REFLEX CULTURE
Bacteria, UA: NONE SEEN
Bilirubin Urine: NEGATIVE
Casts: NONE SEEN
Crystals: NONE SEEN
Glucose, UA: 100 mg/dL — AB
Hgb urine dipstick: NEGATIVE
Ketones, ur: NEGATIVE mg/dL
Leukocytes, UA: NEGATIVE
Nitrite: NEGATIVE
Protein, ur: NEGATIVE mg/dL
Specific Gravity, Urine: 1.029 (ref 1.005–1.030)
Urobilinogen, UA: 1 mg/dL (ref 0.0–1.0)
pH: 6 (ref 5.0–8.0)

## 2012-01-26 LAB — GC/CHLAMYDIA PROBE AMP, GENITAL
Chlamydia, DNA Probe: NEGATIVE
GC Probe Amp, Genital: NEGATIVE

## 2012-01-31 ENCOUNTER — Encounter: Payer: Self-pay | Admitting: Obstetrics and Gynecology

## 2013-03-16 ENCOUNTER — Ambulatory Visit (INDEPENDENT_AMBULATORY_CARE_PROVIDER_SITE_OTHER): Payer: 59 | Admitting: Gynecology

## 2013-03-16 ENCOUNTER — Encounter: Payer: Self-pay | Admitting: Gynecology

## 2013-03-16 VITALS — BP 110/76 | Ht 61.0 in | Wt 178.0 lb

## 2013-03-16 DIAGNOSIS — Z01419 Encounter for gynecological examination (general) (routine) without abnormal findings: Secondary | ICD-10-CM

## 2013-03-16 DIAGNOSIS — N951 Menopausal and female climacteric states: Secondary | ICD-10-CM

## 2013-03-16 DIAGNOSIS — Z79890 Hormone replacement therapy (postmenopausal): Secondary | ICD-10-CM

## 2013-03-16 MED ORDER — ESTRADIOL 0.05 MG/24HR TD PTTW: 1.0000 | MEDICATED_PATCH | TRANSDERMAL | Status: DC

## 2013-03-16 NOTE — Patient Instructions (Signed)
Estradiol skin patches What is this medicine? ESTRADIOL (es tra DYE ole) skin patches contain an estrogen. It is mostly used as hormone replacement in menopausal women. It helps to treat hot flashes and prevent osteoporosis. It is also used to treat women with low estrogen levels or those who have had their ovaries removed. This medicine may be used for other purposes; ask your health care provider or pharmacist if you have questions. What should I tell my health care provider before I take this medicine? They need to know if you have any of these conditions: -abnormal vaginal bleeding -blood vessel disease or blood clots -breast, cervical, endometrial, ovarian, liver, or uterine cancer -dementia -diabetes -gallbladder disease -heart disease or recent heart attack -high blood pressure -high cholesterol -high level of calcium in the blood -hysterectomy -kidney disease -liver disease -migraine headaches -protein C deficiency -protein S deficiency -stroke -systemic lupus erythematosus (SLE) -tobacco smoker -an unusual or allergic reaction to estrogens, other hormones, medicines, foods, dyes, or preservatives -pregnant or trying to get pregnant -breast-feeding How should I use this medicine? This medicine is for external use only. Follow the directions on the prescription label. Tear open the pouch, do not use scissors. Remove the stiff protective liner covering the adhesive. Try not to touch the adhesive. Apply the patch, sticky side to the skin, to an area that is clean, dry and hairless. Avoid injured, irritated, calloused, or scarred areas. Do not apply the skin patches to your breasts or around the waistline. Use a different site each time to prevent skin irritation. Do not cut or trim the patch. Do not stop using except on the advice of your doctor or health care professional. Do not wear more than one patch at a time unless you are told to do so by your doctor or health care  professional. Contact your pediatrician regarding the use of this medicine in children. Special care may be needed. A patient package insert for the product will be given with each prescription and refill. Read this sheet carefully each time. The sheet may change frequently. Overdosage: If you think you have taken too much of this medicine contact a poison control center or emergency room at once. NOTE: This medicine is only for you. Do not share this medicine with others. What if I miss a dose? If you miss a dose, apply it as soon as you can. If it is almost time for your next dose, apply only that dose. Do not apply double or extra doses. What may interact with this medicine? Do not take this medicine with any of the following medications: -aromatase inhibitors like aminoglutethimide, anastrozole, exemestane, letrozole, testolactone This medicine may also interact with the following medications: -carbamazepine -certain antibiotics used to treat infections -certain barbiturates used for inducing sleep or treating seizures -grapefruit juice -medicines for fungus infections like itraconazole and ketoconazole -raloxifene or tamoxifen -rifabutin, rifampin, or rifapentine -ritonavir -St. John's Wort This list may not describe all possible interactions. Give your health care provider a list of all the medicines, herbs, non-prescription drugs, or dietary supplements you use. Also tell them if you smoke, drink alcohol, or use illegal drugs. Some items may interact with your medicine. What should I watch for while using this medicine? Visit your doctor or health care professional for regular checks on your progress. You will need a regular breast and pelvic exam and Pap smear while on this medicine. You should also discuss the need for regular mammograms with your health care professional, and  follow his or her guidelines for these tests. This medicine can make your body retain fluid, making your  fingers, hands, or ankles swell. Your blood pressure can go up. Contact your doctor or health care professional if you feel you are retaining fluid. If you have any reason to think you are pregnant, stop taking this medicine right away and contact your doctor or health care professional. Smoking increases the risk of getting a blood clot or having a stroke while you are taking this medicine, especially if you are more than 51 years old. You are strongly advised not to smoke. If you wear contact lenses and notice visual changes, or if the lenses begin to feel uncomfortable, consult your eye doctor or health care professional. This medicine can increase the risk of developing a condition (endometrial hyperplasia) that may lead to cancer of the lining of the uterus. Taking progestins, another hormone drug, with this medicine lowers the risk of developing this condition. Therefore, if your uterus has not been removed (by a hysterectomy), your doctor may prescribe a progestin for you to take together with your estrogen. You should know, however, that taking estrogens with progestins may have additional health risks. You should discuss the use of estrogens and progestins with your health care professional to determine the benefits and risks for you. If you are going to have surgery or an MRI, you may need to stop taking this medicine. Consult your health care professional for advice before you schedule the surgery. You may bathe or participate in other activities while wearing your patch. If the patch pulls loose or falls off, you may reapply it if the patch is sticky enough to stay on the skin. You should reapply the patch in a different area. Use a fresh patch if it will no longer stick. What side effects may I notice from receiving this medicine? Side effects that you should report to your doctor or health care professional as soon as possible: -allergic reactions like skin rash, itching or hives, swelling of  the face, lips, or tongue -breast tissue changes or discharge -changes in vision -chest pain -confusion, trouble speaking or understanding -dark urine -general ill feeling or flu-like symptoms -light-colored stools -nausea, vomiting -pain, swelling, warmth in the leg -right upper belly pain -severe headaches -shortness of breath -sudden numbness or weakness of the face, arm or leg -trouble walking, dizziness, loss of balance or coordination -unusual vaginal bleeding -yellowing of the eyes or skin Side effects that usually do not require medical attention (report to your doctor or health care professional if they continue or are bothersome): -hair loss -increased hunger or thirst -increased urination -symptoms of vaginal infection like itching, irritation or unusual discharge -unusually weak or tired This list may not describe all possible side effects. Call your doctor for medical advice about side effects. You may report side effects to FDA at 1-800-FDA-1088. Where should I keep my medicine? Keep out of the reach of children. Store at room temperature below 30 degrees C (86 degrees F). Do not store any patches that have been removed from their protective pouch. Throw away any unused medicine after the expiration date. Dispose of used patches properly. Since used patches may still contain active hormones, fold the patch in half so that it sticks to itself prior to disposal. NOTE: This sheet is a summary. It may not cover all possible information. If you have questions about this medicine, talk to your doctor, pharmacist, or health care provider.  2013, [employer]. (08/26/2010 9:19:41 AM) Hormone Therapy At menopause, your body begins making less estrogen and progesterone hormones. This causes the body to stop having menstrual periods. This is because estrogen and progesterone hormones control your periods and menstrual cycle. A lack of estrogen may cause symptoms such  as:  Hot flushes (or hot flashes).  Vaginal dryness.  Dry skin.  Loss of sex drive.  Risk of bone loss (osteoporosis). When this happens, you may choose to take hormone therapy to get back the estrogen lost during menopause. When the hormone estrogen is given alone, it is usually referred to as ET (Estrogen Therapy). When the hormone progestin is combined with estrogen, it is generally called HT (Hormone Therapy). This was formerly known as hormone replacement therapy (HRT). Your caregiver can help you make a decision on what will be best for you. The decision to use HT seems to change often as new studies are done. Many studies do not agree on the benefits of hormone replacement therapy. LIKELY BENEFITS OF HT INCLUDE PROTECTION FROM:  Hot Flushes (also called hot flashes) - A hot flush is a sudden feeling of heat that spreads over the face and body. The skin may redden like a blush. It is connected with sweats and sleep disturbance. Women going through menopause may have hot flushes a few times a month or several times per day depending on the woman.  Osteoporosis (bone loss)- Estrogen helps guard against bone loss. After menopause, a woman's bones slowly lose calcium and become weak and brittle. As a result, bones are more likely to break. The hip, wrist, and spine are affected most often. Hormone therapy can help slow bone loss after menopause. Weight bearing exercise and taking calcium with vitamin D also can help prevent bone loss. There are also medications that your caregiver can prescribe that can help prevent osteoporosis.  Vaginal Dryness - Loss of estrogen causes changes in the vagina. Its lining may become thin and dry. These changes can cause pain and bleeding during sexual intercourse. Dryness can also lead to infections. This can cause burning and itching. (Vaginal estrogen treatment can help relieve pain, itching, and dryness.)  Urinary Tract Infections are more common after  menopause because of lack of estrogen. Some women also develop urinary incontinence because of low estrogen levels in the vagina and bladder.  Possible other benefits of estrogen include a positive effect on mood and short-term memory in women. RISKS AND COMPLICATIONS  Using estrogen alone without progesterone causes the lining of the uterus to grow. This increases the risk of lining of the uterus (endometrial) cancer. Your caregiver should give another hormone called progestin if you have a uterus.  Women who take combined (estrogen and progestin) HT appear to have an increased risk of breast cancer. The risk appears to be small, but increases throughout the time that HT is taken.  Combined therapy also makes the breast tissue slightly denser which makes it harder to read mammograms (breast X-rays).  Combined, estrogen and progesterone therapy can be taken together every day, in which case there may be spotting of blood. HT therapy can be taken cyclically in which case you will have menstrual periods. Cyclically means HT is taken for a set amount of days, then not taken, then this process is repeated.  HT may increase the risk of stroke, heart attack, breast cancer and forming blood clots in your leg.  Transdermal estrogen (estrogen that is absorbed through the skin with a patch or a cream) may  have more positive results with:  Cholesterol.  Blood pressure.  Blood clots. Having the following conditions may indicate you should not have HT:  Endometrial cancer.  Liver disease.  Breast cancer.  Heart disease.  History of blood clots.  Stroke. TREATMENT   If you choose to take HT and have a uterus, usually estrogen and progestin are prescribed.  Your caregiver will help you decide the best way to take the medications.  Possible ways to take estrogen include:  Pills.  Patches.  Gels.  Sprays.  Vaginal estrogen cream, rings and tablets.  It is best to take the lowest  dose possible that will help your symptoms and take them for the shortest period of time that you can.  Hormone therapy can help relieve some of the problems (symptoms) that affect women at menopause. Before making a decision about HT, talk to your caregiver about what is best for you. Be well informed and comfortable with your decisions. HOME CARE INSTRUCTIONS   Follow your caregivers advice when taking the medications.  A Pap test is done to screen for cervical cancer.  The first Pap test should be done at age 58.  Between ages 27 and 57, Pap tests are repeated every 2 years.  Beginning at age 43, you are advised to have a Pap test every 3 years as long as your past 3 Pap tests have been normal.  Some women have medical problems that increase the chance of getting cervical cancer. Talk to your caregiver about these problems. It is especially important to talk to your caregiver if a new problem develops soon after your last Pap test. In these cases, your caregiver may recommend more frequent screening and Pap tests.  The above recommendations are the same for women who have or have not gotten the vaccine for HPV (Human Papillomavirus).  If you had a hysterectomy for a problem that was not a cancer or a condition that could lead to cancer, then you no longer need Pap tests. However, even if you no longer need a Pap test, a regular exam is a good idea to make sure no other problems are starting.   If you are between ages 11 and 47, and you have had normal Pap tests going back 10 years, you no longer need Pap tests. However, even if you no longer need a Pap test, a regular exam is a good idea to make sure no other problems are starting.   If you have had past treatment for cervical cancer or a condition that could lead to cancer, you need Pap tests and screening for cancer for at least 20 years after your treatment.  If Pap tests have been discontinued, risk factors (such as a new sexual  partner) need to be re-assessed to determine if screening should be resumed.  Some women may need screenings more often if they are at high risk for cervical cancer.  Get mammograms done as per the advice of your caregiver. SEEK IMMEDIATE MEDICAL CARE IF:  You develop abnormal vaginal bleeding.  You have pain or swelling in your legs, shortness of breath, or chest pain.  You develop dizziness or headaches.  You have lumps or changes in your breasts or armpits.  You have slurred speech.  You develop weakness or numbness of your arms or legs.  You have pain, burning, or bleeding when urinating.  You develop abdominal pain. Document Released: 02/20/2003 Document Revised: 08/16/2011 Document Reviewed: 06/10/2010 [facility] Patient Information 2014 [place], [place].

## 2013-03-16 NOTE — Progress Notes (Signed)
Janet Kelley [birth year] [id number]   History:    52 y.o.  for annual gyn exam with worsening hot flashes irritability and mood swings, and decreased libido. She has been menopausal since 2010.She is having no vaginal bleeding. She is having no pelvic pain. She is status post LAVH done for endometriosis and fibroids. She was treated with cryosurgery for cervical dysplasia but has had normal Pap smears for greater than 20 years. Her last Pap smear was 2012. She does her lab through PCP. She had normal bone density at our office in 2012. She is not currently sexually active. Her last mammogram was in 2013. Patient with prior history of colon polyps. Her last colonoscopy was in 2004. She received her flu vaccine earlier this year. Review of her record dictating she was weighing 183 pounds last year is now weighing 178 pounds.    Past medical history,surgical history, family history and social history were all reviewed and documented in the EPIC chart.  Gynecologic History No LMP recorded. Patient has had a hysterectomy. Contraception: post menopausal status Last Pap: 2012. Results were: normal Last mammogram: 2013. Results were: normal  Obstetric History OB History  Gravida Para Term Preterm AB SAB TAB Ectopic Multiple Living  0                  ROS: A ROS was performed and pertinent positives and negatives are included in the history.  GENERAL: No fevers or chills. HEENT: No change in vision, no earache, sore throat or sinus congestion. NECK: No pain or stiffness. CARDIOVASCULAR: No chest pain or pressure. No palpitations. PULMONARY: No shortness of breath, cough or wheeze. GASTROINTESTINAL: No abdominal pain, nausea, vomiting or diarrhea, melena or bright red blood per rectum. GENITOURINARY: No urinary frequency, urgency, hesitancy or dysuria. MUSCULOSKELETAL: No joint or muscle pain, no back pain, no recent trauma. DERMATOLOGIC: No rash, no itching, no lesions. ENDOCRINE: No polyuria,  polydipsia, no heat or cold intolerance. No recent change in weight. HEMATOLOGICAL: No anemia or easy bruising or bleeding. NEUROLOGIC: No headache, seizures, numbness, tingling or weakness. PSYCHIATRIC: No depression, no loss of interest in normal activity or change in sleep pattern.     Exam: chaperone present  BP 110/76  Ht 5\' 1"  (1.549 m)  Wt 178 lb (80.74 kg)  BMI 33.65 kg/m2  Body mass index is 33.65 kg/(m^2).  General appearance : Well developed well nourished female. No acute distress HEENT: Neck supple, trachea midline, no carotid bruits, no thyroidmegaly Lungs: Clear to auscultation, no rhonchi or wheezes, or rib retractions  Heart: Regular rate and rhythm, no murmurs or gallops Breast:Examined in sitting and supine position were symmetrical in appearance, no palpable masses or tenderness,  no skin retraction, no nipple inversion, no nipple discharge, no skin discoloration, no axillary or supraclavicular lymphadenopathy Abdomen: no palpable masses or tenderness, no rebound or guarding Extremities: no edema or skin discoloration or tenderness  Pelvic:  Bartholin, Urethra, Skene Glands: Within normal limits             Vagina: atrophic changes  Cervix: absence  Uterus Absence  Adnexa  Without masses or tenderness  Anus and perineum  normal   Rectovaginal  normal sphincter tone without palpated masses or tenderness             Hemoccult Card provided     Assessment/Plan:  52 y.o. femalewith worsening vasomotor symptoms. She would like to proceed and start hormone replacement therapy. We went for a detailed discussion of the  risks benefits and pros and cons of formal replacement therapy. We discussed in detail the women's health initiative study. We discussed different routes of administration of hormone replacement therapy. She prefers the patch. She will be placed on Vivelle-Dot 0.05 mg/24-hour twice a week. She was reminded to schedule a mammogram. Her PCP will be doing her  lab work. We discussed importance of calcium and vitamin D for osteoporosis prevention. c menopause and hormone replacement therapy was provided.  Note: This dictation was prepared with  Dragon/digital dictation along withSmart phrase technology. Any transcriptional errors that result from this process are unintentional.   Ok Edwards MD, 6:03 PM 03/16/2013

## 2013-03-20 ENCOUNTER — Encounter: Payer: Self-pay | Admitting: Obstetrics and Gynecology

## 2013-04-02 ENCOUNTER — Other Ambulatory Visit: Payer: 59 | Admitting: Anesthesiology

## 2013-04-02 DIAGNOSIS — Z1211 Encounter for screening for malignant neoplasm of colon: Secondary | ICD-10-CM

## 2013-05-07 DIAGNOSIS — E559 Vitamin D deficiency, unspecified: Secondary | ICD-10-CM

## 2013-05-07 HISTORY — DX: Vitamin D deficiency, unspecified: E55.9

## 2013-05-10 ENCOUNTER — Ambulatory Visit (INDEPENDENT_AMBULATORY_CARE_PROVIDER_SITE_OTHER): Payer: 59

## 2013-05-10 ENCOUNTER — Other Ambulatory Visit: Payer: Self-pay | Admitting: Gynecology

## 2013-05-10 DIAGNOSIS — Z13820 Encounter for screening for osteoporosis: Secondary | ICD-10-CM

## 2013-05-10 DIAGNOSIS — N951 Menopausal and female climacteric states: Secondary | ICD-10-CM

## 2013-05-10 DIAGNOSIS — Z79890 Hormone replacement therapy (postmenopausal): Secondary | ICD-10-CM

## 2013-05-17 ENCOUNTER — Other Ambulatory Visit: Payer: Self-pay | Admitting: *Deleted

## 2013-05-17 ENCOUNTER — Other Ambulatory Visit: Payer: 59

## 2013-05-17 DIAGNOSIS — M898X9 Other specified disorders of bone, unspecified site: Secondary | ICD-10-CM

## 2013-05-18 ENCOUNTER — Encounter: Payer: Self-pay | Admitting: Gynecology

## 2013-05-18 ENCOUNTER — Other Ambulatory Visit: Payer: Self-pay | Admitting: *Deleted

## 2013-05-18 LAB — PTH, INTACT AND CALCIUM: PTH: 70.7 pg/mL (ref 14.0–72.0)

## 2013-05-18 LAB — VITAMIN D 25 HYDROXY (VIT D DEFICIENCY, FRACTURES): Vit D, 25-Hydroxy: 12 ng/mL — ABNORMAL LOW (ref 30–89)

## 2013-05-18 MED ORDER — VITAMIN D (ERGOCALCIFEROL) 1.25 MG (50000 UNIT) PO CAPS: 50000.0000 [IU] | ORAL_CAPSULE | ORAL | Status: DC

## 2013-07-20 ENCOUNTER — Encounter (HOSPITAL_COMMUNITY): Payer: Self-pay | Admitting: Emergency Medicine

## 2013-07-20 ENCOUNTER — Emergency Department (INDEPENDENT_AMBULATORY_CARE_PROVIDER_SITE_OTHER)
Admission: EM | Admit: 2013-07-20 | Discharge: 2013-07-20 | Disposition: A | Discharge status: Home / Self Care | Payer: 59 | Source: Home / Self Care | Attending: Emergency Medicine | Admitting: Emergency Medicine

## 2013-07-20 DIAGNOSIS — K047 Periapical abscess without sinus: Secondary | ICD-10-CM

## 2013-07-20 MED ORDER — OXYCODONE-ACETAMINOPHEN 5-325 MG PO TABS: ORAL_TABLET | ORAL | Status: DC

## 2013-07-20 MED ORDER — IBUPROFEN 800 MG PO TABS
ORAL_TABLET | ORAL | Status: AC
  Filled 2013-07-20: qty 1

## 2013-07-20 MED ORDER — DICLOFENAC SODIUM 75 MG PO TBEC: 75.0000 mg | DELAYED_RELEASE_TABLET | Freq: Two times a day (BID) | ORAL | Status: DC

## 2013-07-20 MED ORDER — IBUPROFEN 800 MG PO TABS
800.0000 mg | ORAL_TABLET | Freq: Once | ORAL | Status: AC
  Administered 2013-07-20: 800 mg via ORAL

## 2013-07-20 MED ORDER — CLINDAMYCIN HCL 300 MG PO CAPS: 300.0000 mg | ORAL_CAPSULE | Freq: Four times a day (QID) | ORAL | Status: DC

## 2013-07-20 NOTE — Discharge Instructions (Signed)
Abscessed Tooth An abscessed tooth is an infection around your tooth. It may be caused by holes or damage to the tooth (cavity) or a dental disease. An abscessed tooth causes mild to very bad pain in and around the tooth. See your dentist right away if you have tooth or gum pain. HOME CARE  Take your medicine as told. Finish it even if you start to feel better.  Do not drive after taking pain medicine.  Rinse your mouth (gargle) often with salt water ( teaspoon salt in 8 ounces of warm water).  Do not apply heat to the outside of your face. GET HELP RIGHT AWAY IF:   You have a temperature by mouth above 102 F (38.9 C), not controlled by medicine.  You have chills and a very bad headache.  You have problems breathing or swallowing.  Your mouth will not open.  You develop puffiness (swelling) on the neck or around the eye.  Your pain is not helped by medicine.  Your pain is getting worse instead of better. MAKE SURE YOU:   Understand these instructions.  Will watch your condition.  Will get help right away if you are not doing well or get worse. Document Released: 11/10/2007 Document Revised: 08/16/2011 Document Reviewed: 09/01/2010 [facility] Patient Information 2014 [place].  Look up the [facility] for free dental clinics. [email or web address].com.asp  Get there early and be prepared to wait. [facility] and [employer] have [employer], advertising schools that provide low cost routine dental care.   Other resources: [facility] [place], [place] [phone]  Patients with Medicaid: [address] [place] [facility] Phone:  [phone]                                                  Phone:  [phone]  If unable to pay or uninsured, contact:  [facility] or [facility]. to become qualified for the adult dental clinic.  No matter what dental problem you have, it will not get better unless you get good dental care.  If the tooth is not taken care of, your symptoms will come back in time and you will be visiting [place] again in the [facility]gent [place] with a bad toothache.  So, see your dentist as soon as possible.  If you don't have a dentist, we can give you a list of dentists.  Sometimes the most cost effective treatment is removal of the tooth.  This can be done very inexpensively through one of the low cost Dance movement psychotherapist such as the facility on [facility] in [place] [phone]).  The downside to this is that you will have one less tooth and this can effect your ability to chew.  Some other things that can be done for a dental infection include the following:   Rinse your mouth out with hot salt water (1/2 tsp of table salt and a pinch of baking soda in 8  oz of hot water).  You can do this every 2 or 3 hours.  Avoid cold foods, beverages, and cold air.  This will make your symptoms worse.  Sleep with your head elevated.  Sleeping flat will cause your gums and oral tissues to swell and make them hurt more.  You can sleep on several pillows.  Even better is to sleep in a recliner with your head higher than your heart.  For mild to moderate pain, you can take Tylenol, ibuprofen, or Aleve.  External application of heat by a heating pad, hot water bottle, or hot wet towel can help with pain and speed healing.  You can do this every 2 to 3 hours. Do not fall asleep on a heating pad since this can cause a burn.

## 2013-07-20 NOTE — ED Notes (Signed)
C/o L upper jaw pain and swelling onset today.

## 2013-07-20 NOTE — ED Provider Notes (Signed)
Chief Complaint   Chief Complaint  Patient presents with  . Oral Swelling    History of Present Illness   Janet Kelley is a 53 year old female who has had a two-day history of pain over her left, upper bicuspid. There is swelling of the gingiva. She denies any injury to the tooth or cracking. It has not been filled. She denies any fever or chills. She has had some headache. She denies any facial pain. There is no swelling or pain in the neck. She able to open and close her mouth fully. It hurts to chew on that side. She has no difficulty swallowing or breathing. No swelling of the tongue or throat.  Review of Systems   Other than as noted above, the patient denies any of the following symptoms: Systemic:  No fever, chills,  Or sweats. ENT:  No headache, ear ache, sore throat, nasal congestion, facial pain, or swelling. Lymphatic:  No adenopathy. Lungs:  No coughing, wheezing or shortness of breath.  [place]   Past medical history, family history, social history, meds, and allergies were reviewed. She is allergic to Ceclor. She is a diabetic and has hypercholesterolemia. Current meds include citalopram, clonazepam, Bydureon, Allegra, Janumet, Crestor, vitamin D2, and estradiol.  Physical Examination     Vital signs:  BP 132/80  Pulse 92  Temp(Src) 99 F (37.2 C) (Oral)  Resp 16  SpO2 96% General:  Alert, oriented, in no distress. ENT:  TMs and canals normal.  Nasal mucosa normal. Mouth exam:  Her teeth are in fairly good repair. There is swelling of the gingiva and pain to palpation around the bicuspid on the left upper dentition. She only has one bicuspid in that area. The other one has presumably been removed, and the teeth have moved. There is no visible decay. No swelling of the tongue or the floor the mouth. The pharynx is clear the airway is widely patent. Neck:  No swelling or adenopathy. Lungs:  Breath sounds clear and equal bilaterally.  No wheezes, rales or  rhonchi. Heart:  Regular rhythm.  No gallops or murmers. Skin:  Clear, warm and dry.   Course in [facility] [place]   She was given ibuprofen 800 mg by mouth.  Assessment   The encounter diagnosis was Dental abscess.  She will need to see a dentist and probably have a root canal done.  Plan   1.  Meds:  The following meds were prescribed:   Discharge Medication List as of 07/20/2013  6:36 PM    START taking these medications   Details  clindamycin (CLEOCIN) 300 MG capsule Take 1 capsule (300 mg total) by mouth 4 (four) times daily., Starting 07/20/2013, Until Discontinued, Normal    diclofenac (VOLTAREN) 75 MG EC tablet Take 1 tablet (75 mg total) by mouth 2 (two) times daily., Starting 07/20/2013, Until Discontinued, Normal    oxyCODONE-acetaminophen (PERCOCET) 5-325 MG per tablet 1 to 2 tablets every 6 hours as needed for pain., Print        2.  Patient Education/Counseling:  The patient was given appropriate handouts, self care instructions, and instructed in symptomatic relief. Suggested sleeping with head of bed elevated and hot salt water mouthwash.   3.  Follow up:  The patient was told to follow up if no better in 3 to 4 days, if becoming worse in any way, and given some red flag symptoms such as difficulty swallowing or breathing which would prompt immediate return.  Follow up with a  dentist as soon as posssible.     Harden Mo, MD 07/20/13 [phone]

## 2013-07-23 ENCOUNTER — Other Ambulatory Visit: Payer: Self-pay | Admitting: *Deleted

## 2013-07-23 DIAGNOSIS — F22 Delusional disorders: Secondary | ICD-10-CM

## 2013-07-23 DIAGNOSIS — E559 Vitamin D deficiency, unspecified: Secondary | ICD-10-CM

## 2013-09-12 ENCOUNTER — Encounter (HOSPITAL_COMMUNITY): Payer: Self-pay | Admitting: Emergency Medicine

## 2013-09-12 ENCOUNTER — Emergency Department (INDEPENDENT_AMBULATORY_CARE_PROVIDER_SITE_OTHER)
Admission: EM | Admit: 2013-09-12 | Discharge: 2013-09-12 | Disposition: A | Discharge status: Home / Self Care | Payer: 59 | Source: Home / Self Care | Attending: Emergency Medicine | Admitting: Emergency Medicine

## 2013-09-12 ENCOUNTER — Emergency Department (INDEPENDENT_AMBULATORY_CARE_PROVIDER_SITE_OTHER): Payer: 59

## 2013-09-12 DIAGNOSIS — S93409A Sprain of unspecified ligament of unspecified ankle, initial encounter: Secondary | ICD-10-CM

## 2013-09-12 DIAGNOSIS — S0990XA Unspecified injury of head, initial encounter: Secondary | ICD-10-CM

## 2013-09-12 DIAGNOSIS — S93609A Unspecified sprain of unspecified foot, initial encounter: Secondary | ICD-10-CM

## 2013-09-12 DIAGNOSIS — W108XXA Fall (on) (from) other stairs and steps, initial encounter: Secondary | ICD-10-CM

## 2013-09-12 DIAGNOSIS — W19XXXA Unspecified fall, initial encounter: Secondary | ICD-10-CM

## 2013-09-12 MED ORDER — OXYCODONE-ACETAMINOPHEN 5-325 MG PO TABS: ORAL_TABLET | ORAL | Status: DC

## 2013-09-12 NOTE — Discharge Instructions (Signed)

## 2013-09-12 NOTE — ED Notes (Signed)
I thought i was on the last step, and had 2 more to go. Injury to right foot last PM

## 2013-09-12 NOTE — ED Provider Notes (Signed)
Chief Complaint   Chief Complaint  Patient presents with  . Foot Pain    History of Present Illness   Janet Kelley is a 53 year old female who was going down some steps when she missed the last 2 steps and slipped and fell, landing on her right side. She twisted her right ankle and foot and has pain above the ankle and the foot. She also hit the right side of her head. There was no loss of consciousness. Today she has a mild headache but nothing severe. She denies any neck or facial pain. She hasn't had no upper extremity, chest, lower back, or abdominal pain. She denies any hip or ankle pain. Her biggest pain right now is in her right ankle and foot. She is able to walk with a limp. It's swollen. It hurts to move. There is no numbness or tingling.  Review of Systems   Other than as noted above, the patient denies any of the following symptoms: ENT:  No headache, facial pain, or bleeding from the nose or ears.  No loose or broken teeth. Neck:  No neck pain or stiffnes. Cardiac:  No chest pain. No palpitations, dizziness, syncope or fainting. GI:  No abdominal pain. No nausea, vomiting, or diarrhea. M-S:  No extremity pain, swelling, bruising, limited ROM, or back pain. Neuro:  No loss of consciousness, seizure activity, dizziness, vertigo, paresthesias, numbness, or weakness.  No difficulty with speech or ambulation.  [place]   Past medical history, family history, social history, meds, and allergies were reviewed.  She is allergic to Ceclor. Current meds include Celexa, Klonopin, estradiol, Bydureon, Allegra, Janumet, Crestor, and vitamin D.  Physical Examination    Vital signs:  BP 148/71  Pulse 97  Temp(Src) 98 F (36.7 C) (Oral)  Resp 12  SpO2 100% General:  Alert, oriented and in no distress. Eye:  PERRL, full EOMs. ENT:  No cranial or facial tenderness to palpation. Neck:  No tenderness to palpation.  Full ROM without pain. Heart:  Regular rhythm.  No extrasystoles,  gallops, or murmers. Lungs:  No chest wall tenderness to palpation. Breath sounds clear and equal bilaterally.  No wheezes, rales or rhonchi. Abdomen:  Non tender. Back:  Non tender to palpation.  Full ROM without pain. Extremities:  She has tenderness to palpation over her lateral malleolus and minimal swelling. The ankle has a full range of motion with pain on dorsiflexion and plantarflexion. There is also some pain to palpation of the dorsum of the foot but no swelling or bruising.  Full ROM of all joints without pain.  Pulses full.  Brisk capillary refill. Neuro:  Alert and oriented times 3.  Cranial nerves intact.  No muscle weakness.  Sensation intact to light touch.  Gait normal. Skin:  No bruising, abrasions, or lacerations.  Radiology   Dg Ankle Complete Right  09/12/2013   CLINICAL DATA:  Status post fall.  Right ankle pain.  EXAM: RIGHT ANKLE - COMPLETE 3+ VIEW  COMPARISON:  None.  FINDINGS: No acute bony or joint abnormality. There is some soft tissue swelling about the lateral malleolus.  IMPRESSION: Lateral soft tissue swelling without underlying fracture.   Electronically Signed   By: Inge Rise M.D.   On: 09/12/2013 09:34   Dg Foot Complete Right  09/12/2013   CLINICAL DATA:  Status post fall last night.  Right foot pain.  EXAM: RIGHT FOOT COMPLETE - 3+ VIEW  COMPARISON:  Plain films right foot 08/18/2012.  FINDINGS: Imaged  bones, joints and soft tissues appear normal.  IMPRESSION: Negative exam.   Electronically Signed   By: Inge Rise M.D.   On: 09/12/2013 09:28    Course in [facility] [place]   She was placed in an ASO brace.  Assessment   The primary encounter diagnosis was Ankle sprain. Diagnoses of Foot sprain, Fall, and Head injury were also pertinent to this visit.  The injury appears mild. She was given head injury precautions. Also given exercises for the ankle. Suggested she followup with Dr. Mardelle Matte if no improvement in 2 weeks.  Plan   1.  Meds:  The  following meds were prescribed:   Discharge Medication List as of 09/12/2013 10:04 AM    START taking these medications   Details  !! oxyCODONE-acetaminophen (PERCOCET) 5-325 MG per tablet 1 to 2 tablets every 6 hours as needed for pain., Print     !! - Potential duplicate medications found. Please discuss with provider.      2.  Patient Education/Counseling:  The patient was given appropriate handouts, self care instructions, and instructed in symptomatic relief.    3.  Follow up:  The patient was told to follow up here if no better in 3 to 4 days, or sooner if becoming worse in any way, and given some red flag symptoms such as increasing pain or new neurological symptoms which would prompt immediate return.  Follow up here if necessary.     Harden Mo, MD 09/12/13 [phone]

## 2013-11-23 ENCOUNTER — Telehealth: Payer: Self-pay | Admitting: *Deleted

## 2013-11-23 NOTE — Telephone Encounter (Signed)
Pt called c/o vaginal irritation with odor x 3 days now, pt requesting mediation. I explained to patient OV best.

## 2013-11-27 ENCOUNTER — Encounter: Payer: Self-pay | Admitting: Gynecology

## 2013-11-27 ENCOUNTER — Ambulatory Visit (INDEPENDENT_AMBULATORY_CARE_PROVIDER_SITE_OTHER): Payer: 59 | Admitting: Gynecology

## 2013-11-27 VITALS — BP 130/78

## 2013-11-27 DIAGNOSIS — A499 Bacterial infection, unspecified: Secondary | ICD-10-CM

## 2013-11-27 DIAGNOSIS — N898 Other specified noninflammatory disorders of vagina: Secondary | ICD-10-CM

## 2013-11-27 DIAGNOSIS — Z113 Encounter for screening for infections with a predominantly sexual mode of transmission: Secondary | ICD-10-CM

## 2013-11-27 DIAGNOSIS — N760 Acute vaginitis: Secondary | ICD-10-CM

## 2013-11-27 DIAGNOSIS — Z8639 Personal history of other endocrine, nutritional and metabolic disease: Secondary | ICD-10-CM

## 2013-11-27 DIAGNOSIS — B9689 Other specified bacterial agents as the cause of diseases classified elsewhere: Secondary | ICD-10-CM

## 2013-11-27 LAB — WET PREP FOR TRICH, YEAST, CLUE
Trich, Wet Prep: NONE SEEN
WBC WET PREP: NONE SEEN
YEAST WET PREP: NONE SEEN

## 2013-11-27 MED ORDER — METRONIDAZOLE 500 MG PO TABS: 500.0000 mg | ORAL_TABLET | Freq: Two times a day (BID) | ORAL | Status: DC

## 2013-11-27 NOTE — Patient Instructions (Addendum)
Vitamin D3 2,000 units one tablet qdaily  Metronidazole tablets or capsules What is this medicine? METRONIDAZOLE (me troe NI da zole) is an antiinfective. It is used to treat certain kinds of bacterial and protozoal infections. It will not work for colds, flu, or other viral infections. This medicine may be used for other purposes; ask your health care provider or pharmacist if you have questions. COMMON BRAND NAME(S): Flagyl What should I tell my health care provider before I take this medicine? They need to know if you have any of these conditions: -anemia or other blood disorders -disease of the nervous system -fungal or yeast infection -if you drink alcohol containing drinks -liver disease -seizures -an unusual or allergic reaction to metronidazole, or other medicines, foods, dyes, or preservatives -pregnant or trying to get pregnant -breast-feeding How should I use this medicine? Take this medicine by mouth with a full glass of water. Follow the directions on the prescription label. Take your medicine at regular intervals. Do not take your medicine more often than directed. Take all of your medicine as directed even if you think you are better. Do not skip doses or stop your medicine early. Talk to your pediatrician regarding the use of this medicine in children. Special care may be needed. Overdosage: If you think you have taken too much of this medicine contact a poison control center or emergency room at once. NOTE: This medicine is only for you. Do not share this medicine with others. What if I miss a dose? If you miss a dose, take it as soon as you can. If it is almost time for your next dose, take only that dose. Do not take double or extra doses. What may interact with this medicine? Do not take this medicine with any of the following medications: -alcohol or any product that contains alcohol -amprenavir oral  solution -cisapride -disulfiram -dofetilide -dronedarone -paclitaxel injection -pimozide -ritonavir oral solution -sertraline oral solution -sulfamethoxazole-trimethoprim injection -thioridazine -ziprasidone This medicine may also interact with the following medications: -birth control pills -cimetidine -lithium -other medicines that prolong the QT interval (cause an abnormal heart rhythm) -phenobarbital -phenytoin -warfarin This list may not describe all possible interactions. Give your health care provider a list of all the medicines, herbs, non-prescription drugs, or dietary supplements you use. Also tell them if you smoke, drink alcohol, or use illegal drugs. Some items may interact with your medicine. What should I watch for while using this medicine? Tell your doctor or health care professional if your symptoms do not improve or if they get worse. You may get drowsy or dizzy. Do not drive, use machinery, or do anything that needs mental alertness until you know how this medicine affects you. Do not stand or sit up quickly, especially if you are an older patient. This reduces the risk of dizzy or fainting spells. Avoid alcoholic drinks while you are taking this medicine and for three days afterward. Alcohol may make you feel dizzy, sick, or flushed. If you are being treated for a sexually transmitted disease, avoid sexual contact until you have finished your treatment. Your sexual partner may also need treatment. What side effects may I notice from receiving this medicine? Side effects that you should report to your doctor or health care professional as soon as possible: -allergic reactions like skin rash or hives, swelling of the face, lips, or tongue -confusion, clumsiness -difficulty speaking -discolored or sore mouth -dizziness -fever, infection -numbness, tingling, pain or weakness in the hands or feet -trouble  passing urine or change in the amount of urine -redness,  blistering, peeling or loosening of the skin, including inside the mouth -seizures -unusually weak or tired -vaginal irritation, dryness, or discharge Side effects that usually do not require medical attention (report to your doctor or health care professional if they continue or are bothersome): -diarrhea -headache -irritability -metallic taste -nausea -stomach pain or cramps -trouble sleeping This list may not describe all possible side effects. Call your doctor for medical advice about side effects. You may report side effects to FDA at 1-800-FDA-1088. Where should I keep my medicine? Keep out of the reach of children. Store at room temperature below 25 degrees C (77 degrees F). Protect from light. Keep container tightly closed. Throw away any unused medicine after the expiration date. NOTE: This sheet is a summary. It may not cover all possible information. If you have questions about this medicine, talk to your doctor, pharmacist, or health care provider.  2015, Elsevier/Gold Standard. (2012-12-29 14:08:39) Bacterial Vaginosis Bacterial vaginosis is a vaginal infection that occurs when the normal balance of bacteria in the vagina is disrupted. It results from an overgrowth of certain bacteria. This is the most common vaginal infection in women of childbearing age. Treatment is important to prevent complications, especially in pregnant women, as it can cause a premature delivery. CAUSES  Bacterial vaginosis is caused by an increase in harmful bacteria that are normally present in smaller amounts in the vagina. Several different kinds of bacteria can cause bacterial vaginosis. However, the reason that the condition develops is not fully understood. RISK FACTORS Certain activities or behaviors can put you at an increased risk of developing bacterial vaginosis, including:  Having a new sex partner or multiple sex partners.  Douching.  Using an intrauterine device (IUD) for  contraception. Women do not get bacterial vaginosis from toilet seats, bedding, swimming pools, or contact with objects around them. SIGNS AND SYMPTOMS  Some women with bacterial vaginosis have no signs or symptoms. Common symptoms include:  Grey vaginal discharge.  A fishlike odor with discharge, especially after sexual intercourse.  Itching or burning of the vagina and vulva.  Burning or pain with urination. DIAGNOSIS  Your health care provider will take a medical history and examine the vagina for signs of bacterial vaginosis. A sample of vaginal fluid may be taken. Your health care provider will look at this sample under a microscope to check for bacteria and abnormal cells. A vaginal pH test may also be done.  TREATMENT  Bacterial vaginosis may be treated with antibiotic medicines. These may be given in the form of a pill or a vaginal cream. A second round of antibiotics may be prescribed if the condition comes back after treatment.  HOME CARE INSTRUCTIONS   Only take over-the-counter or prescription medicines as directed by your health care provider.  If antibiotic medicine was prescribed, take it as directed. Make sure you finish it even if you start to feel better.  Do not have sex until treatment is completed.  Tell all sexual partners that you have a vaginal infection. They should see their health care provider and be treated if they have problems, such as a mild rash or itching.  Practice safe sex by using condoms and only having one sex partner. SEEK MEDICAL CARE IF:   Your symptoms are not improving after 3 days of treatment.  You have increased discharge or pain.  You have a fever. MAKE SURE YOU:   Understand these instructions.  Will  watch your condition.  Will get help right away if you are not doing well or get worse. FOR MORE INFORMATION  Centers for Disease Control and Prevention, Division of STD Prevention: [employer]raud[employer] American Sexual Health  Association (ASHA): www.ashastd.org  Document Released: 05/24/2005 Document Revised: 03/14/2013 Document Reviewed: 01/03/2013 [facility] Patient Information 2015 [place], [place]. This information is not intended to replace advice given to you by your health care provider. Make sure you discuss any questions you have with your health care provider.

## 2013-11-27 NOTE — Progress Notes (Signed)
   Patient presented to the office today complaining of vaginal discharge and wanted to an STD screen she has a new partner. She has been using condoms. Patient with past history of having had a hysterectomy. Patient stated that she had an odor with some pruritus as well. Review of patient's rectum indicated last year she had vitamin D deficiency and was started on vitamin D 50,000 units q. weekly for 12 weeks and she did not return to the office for vitamin D level. She's currently not taking vitamin D supplement as had been recommended.  Exam: Bartholin urethra Skene was within normal limits Vagina: Ammonia like odor but no true discharge seen.  Wet prep:  Amine positive for Includes cells moderate Bacteria too numerous to count  Assessment/plan: Clinical evidence of bacterial vaginosis. Patient will be started on Flagyl 500 mg twice a day for 7 days. GC and chlamydia culture was obtained today results pending at time of this dictation. Patient will stop by the lab we'll check HIV, RPR, hepatitis B and C. to complete the STD screen. Patient was vitamin D deficiency history treated for 12 weeks but did not return for followup. We will check her vitamin D level today. I have encouraged her to begin taking vitamin D 3 2000 units one tablet daily.

## 2013-11-28 ENCOUNTER — Other Ambulatory Visit: Payer: Self-pay | Admitting: Gynecology

## 2013-11-28 DIAGNOSIS — E559 Vitamin D deficiency, unspecified: Secondary | ICD-10-CM

## 2013-11-28 LAB — VITAMIN D 25 HYDROXY (VIT D DEFICIENCY, FRACTURES): VIT D 25 HYDROXY: 23 ng/mL — AB (ref 30–89)

## 2013-11-28 LAB — HEPATITIS C ANTIBODY: HCV Ab: NEGATIVE

## 2013-11-28 LAB — GC/CHLAMYDIA PROBE AMP
CT Probe RNA: NEGATIVE
GC PROBE AMP APTIMA: NEGATIVE

## 2013-11-28 LAB — RPR

## 2013-11-28 LAB — HEPATITIS B SURFACE ANTIGEN: Hepatitis B Surface Ag: NEGATIVE

## 2013-11-28 LAB — HIV ANTIBODY (ROUTINE TESTING W REFLEX): HIV 1&2 Ab, 4th Generation: NONREACTIVE

## 2013-11-28 MED ORDER — VITAMIN D (ERGOCALCIFEROL) 1.25 MG (50000 UNIT) PO CAPS: 50000.0000 [IU] | ORAL_CAPSULE | ORAL | Status: DC

## 2013-12-05 ENCOUNTER — Encounter (HOSPITAL_COMMUNITY): Payer: Self-pay | Admitting: Emergency Medicine

## 2013-12-05 ENCOUNTER — Emergency Department (HOSPITAL_COMMUNITY): Payer: 59

## 2013-12-05 ENCOUNTER — Emergency Department (HOSPITAL_COMMUNITY)
Admission: EM | Admit: 2013-12-05 | Discharge: 2013-12-05 | Disposition: A | Discharge status: Nursing Home (Includes ICF) | Payer: 59 | Source: Home / Self Care | Attending: Family Medicine | Admitting: Family Medicine

## 2013-12-05 ENCOUNTER — Emergency Department (HOSPITAL_COMMUNITY)
Admission: EM | Admit: 2013-12-05 | Discharge: 2013-12-06 | Disposition: A | Discharge status: Home / Self Care | Payer: 59 | Attending: Emergency Medicine | Admitting: Emergency Medicine

## 2013-12-05 DIAGNOSIS — E7800 Pure hypercholesterolemia, unspecified: Secondary | ICD-10-CM | POA: Insufficient documentation

## 2013-12-05 DIAGNOSIS — K226 Gastro-esophageal laceration-hemorrhage syndrome: Secondary | ICD-10-CM | POA: Insufficient documentation

## 2013-12-05 DIAGNOSIS — D259 Leiomyoma of uterus, unspecified: Secondary | ICD-10-CM | POA: Insufficient documentation

## 2013-12-05 DIAGNOSIS — K920 Hematemesis: Secondary | ICD-10-CM

## 2013-12-05 DIAGNOSIS — Z79899 Other long term (current) drug therapy: Secondary | ICD-10-CM | POA: Insufficient documentation

## 2013-12-05 DIAGNOSIS — Z87891 Personal history of nicotine dependence: Secondary | ICD-10-CM | POA: Insufficient documentation

## 2013-12-05 DIAGNOSIS — N8000 Endometriosis of the uterus, unspecified: Secondary | ICD-10-CM | POA: Insufficient documentation

## 2013-12-05 DIAGNOSIS — N879 Dysplasia of cervix uteri, unspecified: Secondary | ICD-10-CM | POA: Insufficient documentation

## 2013-12-05 DIAGNOSIS — E119 Type 2 diabetes mellitus without complications: Secondary | ICD-10-CM | POA: Insufficient documentation

## 2013-12-05 DIAGNOSIS — E559 Vitamin D deficiency, unspecified: Secondary | ICD-10-CM | POA: Insufficient documentation

## 2013-12-05 DIAGNOSIS — Z881 Allergy status to other antibiotic agents status: Secondary | ICD-10-CM | POA: Insufficient documentation

## 2013-12-05 MED ORDER — GI COCKTAIL ~~LOC~~
30.0000 mL | Freq: Once | ORAL | Status: AC
  Administered 2013-12-06: 30 mL via ORAL
  Filled 2013-12-05: qty 30

## 2013-12-05 NOTE — ED Notes (Signed)
MD horton at bedside.

## 2013-12-05 NOTE — ED Notes (Signed)
C/o vomiting onset this AM, as she kept retching she started vomiting bright red blood-large amount.  C/o sore throat.  Gargled with warm salt water.  She took a Vicodin and woke up and felt like something in her throat

## 2013-12-05 NOTE — ED Notes (Addendum)
Pt in from urgent care, went there due to one episode of vomiting that occurred this morning, states she vomited and saw blood in vomit, c/o sore throat since that time, sent here for evaluation to r/o mallory -weiss tear. Pt denies further vomiting, denies coughing up any blood.

## 2013-12-05 NOTE — ED Provider Notes (Signed)
CSN: [id number]     Arrival date & time 12/05/13  1924 History   First MD Initiated Contact with Patient 12/05/13 2001     Chief Complaint  Patient presents with  . Hematemesis  . Sore Throat   (Consider location/radiation/quality/duration/timing/severity/associated sxs/prior Treatment) Patient is a 53 y.o. female presenting with pharyngitis. The history is provided by the patient.  Sore Throat This is a new problem. The current episode started 6 to 12 hours ago (vomited x1 this am but retching produce a lot of blood according to pt, none since but continues with pain in throat with swallowing, no abd pain.). The problem has been gradually improving. Pertinent negatives include no abdominal pain. The symptoms are aggravated by swallowing.    Past Medical History  Diagnosis Date  . Diabetes mellitus     Type 2  . Endometriosis   . Fibroid   . Elevated cholesterol   . Anxiety   . Cervical dysplasia   . Vitamin D deficiency December 2014   Past Surgical History  Procedure Laterality Date  . Pelvic laparoscopy      DL laser endometriosis  . Vaginal hysterectomy  2009    [facility]  . Cervical disc surgery    . Colposcopy    . Gynecologic cryosurgery    . Knee surgery      Arthroscopic   Family History  Problem Relation Age of Onset  . Hypertension Mother   . Heart disease Mother   . Diabetes Mother   . Cancer Mother     throat cancer  . Hypertension Sister   . Heart disease Paternal Uncle   . Colon cancer Paternal Grandmother   . Colon cancer Cousin     STOMACH   History  Substance Use Topics  . Smoking status: Former Smoker    Types: Cigarettes    Quit date: 03/16/2006  . Smokeless tobacco: Not on file  . Alcohol Use: 1.0 oz/week    2 drink(s) per week     Comment: occasiaonal   OB History   Grav Para Term Preterm Abortions TAB SAB Ect Mult Living   0              Review of Systems  Constitutional: Negative.   HENT: Positive for trouble swallowing.    Cardiovascular: Negative.   Gastrointestinal: Positive for vomiting. Negative for nausea, abdominal pain, diarrhea and constipation.    Allergies  Ceclor  Home Medications   Prior to Admission medications   Medication Sig Start Date End Date Taking? Authorizing Provider  citalopram (CELEXA) 20 MG tablet Take 20 mg by mouth daily.  08/16/11   Historical Provider, MD  clindamycin (CLEOCIN) 300 MG capsule Take 1 capsule (300 mg total) by mouth 4 (four) times daily. 07/20/13   Harden Mo, MD  clonazePAM (KLONOPIN) 0.5 MG tablet Take 0.5 mg by mouth daily.  07/15/11   Historical Provider, MD  Dapagliflozin Propanediol (FARXIGA) 5 MG TABS Take by mouth.    Historical Provider, MD  diclofenac (VOLTAREN) 75 MG EC tablet Take 1 tablet (75 mg total) by mouth 2 (two) times daily. 07/20/13   Harden Mo, MD  estradiol (VIVELLE-DOT) 0.05 MG/24HR patch Place 1 patch (0.05 mg total) onto the skin 2 (two) times a week. 03/16/13   Terrance Mass, MD  fexofenadine (ALLEGRA) 180 MG tablet Take 180 mg by mouth daily.    Historical Provider, MD  JANUMET 50-500 MG per tablet Take 1 tablet by mouth daily.  08/11/11   Historical Provider, MD  metroNIDAZOLE (FLAGYL) 500 MG tablet Take 1 tablet (500 mg total) by mouth 2 (two) times daily. 11/27/13   Terrance Mass, MD  oxyCODONE-acetaminophen (PERCOCET) 5-325 MG per tablet 1 to 2 tablets every 6 hours as needed for pain. 07/20/13   Harden Mo, MD  oxyCODONE-acetaminophen (PERCOCET) 5-325 MG per tablet 1 to 2 tablets every 6 hours as needed for pain. 09/12/13   Harden Mo, MD  rosuvastatin (CRESTOR) 20 MG tablet Take 20 mg by mouth daily.    Historical Provider, MD  Vitamin D, Ergocalciferol, (DRISDOL) 50000 UNITS CAPS capsule Take 1 capsule (50,000 Units total) by mouth every 7 (seven) days. 11/28/13   Terrance Mass, MD   BP 142/89  Pulse 80  Temp(Src) 98 F (36.7 C) (Oral)  Resp 16  SpO2 100% Physical Exam  Nursing note and vitals  reviewed. Constitutional: She is oriented to person, place, and time. She appears well-developed and well-nourished. No distress.  HENT:  Mouth/Throat: Oropharynx is clear and moist.  Neck: Normal range of motion. Neck supple.  Cardiovascular: Normal heart sounds.   Abdominal: Soft. Bowel sounds are normal. She exhibits no distension and no mass. There is no tenderness. There is no rebound and no guarding.  Lymphadenopathy:    She has no cervical adenopathy.  Neurological: She is alert and oriented to person, place, and time.  Skin: Skin is warm and dry.    ED Course  Procedures (including critical care time) Labs Review Labs Reviewed - No data to display  Imaging Review No results found.   MDM   1. Hematemesis without nausea   sent for GI eval of poss mallory -weiss tear from vomiting this am.     Billy Fischer, MD 12/05/13 2049

## 2013-12-06 LAB — CBC WITH DIFFERENTIAL/PLATELET
Basophils Absolute: 0 10*3/uL (ref 0.0–0.1)
Basophils Relative: 0 % (ref 0–1)
Eosinophils Absolute: 0.1 10*3/uL (ref 0.0–0.7)
Eosinophils Relative: 2 % (ref 0–5)
HEMATOCRIT: 36.8 % (ref 36.0–46.0)
Hemoglobin: 11.9 g/dL — ABNORMAL LOW (ref 12.0–15.0)
LYMPHS PCT: 39 % (ref 12–46)
Lymphs Abs: 2 10*3/uL (ref 0.7–4.0)
MCH: 28.1 pg (ref 26.0–34.0)
MCHC: 32.3 g/dL (ref 30.0–36.0)
MCV: 87 fL (ref 78.0–100.0)
MONO ABS: 0.3 10*3/uL (ref 0.1–1.0)
MONOS PCT: 6 % (ref 3–12)
Neutro Abs: 2.8 10*3/uL (ref 1.7–7.7)
Neutrophils Relative %: 53 % (ref 43–77)
Platelets: 266 10*3/uL (ref 150–400)
RBC: 4.23 MIL/uL (ref 3.87–5.11)
RDW: 12.7 % (ref 11.5–15.5)
WBC: 5.3 10*3/uL (ref 4.0–10.5)

## 2013-12-06 MED ORDER — LIDOCAINE VISCOUS 2 % MT SOLN: 15.0000 mL | OROMUCOSAL | Status: DC | PRN

## 2013-12-06 MED ORDER — ONDANSETRON HCL 4 MG PO TABS: 4.0000 mg | ORAL_TABLET | Freq: Four times a day (QID) | ORAL | Status: DC

## 2013-12-06 MED ORDER — OMEPRAZOLE 20 MG PO CPDR: 20.0000 mg | DELAYED_RELEASE_CAPSULE | Freq: Every day | ORAL | Status: DC

## 2013-12-06 NOTE — ED Provider Notes (Signed)
CSN: [id number]     Arrival date & time 12/05/13  2102 History   First MD Initiated Contact with Patient 12/05/13 2330     Chief Complaint  Patient presents with  . Emesis     (Consider location/radiation/quality/duration/timing/severity/associated sxs/prior Treatment) HPI  This a 53 year old female with history of diabetes who presents following an episode of hematemesis and sore throat. Patient states that she woke up this morning with "a queasy stomach." She had one episode of vomiting of gastric contents followed by "a lot of retching." Patient reports that she subsequently had vomiting of bright red blood which she states was at least a handful. This was at approximately 8 AM this morning. Since that time she has had no recurrent episodes of vomiting or hematemesis. She reports a sore throat. She was able to tolerate lunch which included water and a hotdog. She states that every time she swallows it hurts. She was evaluated at urgent care and was referred here for further evaluation. She denies any chest pain or shortness of breath.  Patient denies any abdominal pain or diarrhea.  Past Medical History  Diagnosis Date  . Diabetes mellitus     Type 2  . Endometriosis   . Fibroid   . Elevated cholesterol   . Anxiety   . Cervical dysplasia   . Vitamin D deficiency December 2014   Past Surgical History  Procedure Laterality Date  . Pelvic laparoscopy      DL laser endometriosis  . Vaginal hysterectomy  2009    [facility]  . Cervical disc surgery    . Colposcopy    . Gynecologic cryosurgery    . Knee surgery      Arthroscopic   Family History  Problem Relation Age of Onset  . Hypertension Mother   . Heart disease Mother   . Diabetes Mother   . Cancer Mother     throat cancer  . Hypertension Sister   . Heart disease Paternal Uncle   . Colon cancer Paternal Grandmother   . Colon cancer Cousin     STOMACH   History  Substance Use Topics  . Smoking status: Former Smoker     Types: Cigarettes    Quit date: 03/16/2006  . Smokeless tobacco: Not on file  . Alcohol Use: 1.0 oz/week    2 drink(s) per week     Comment: occasiaonal   OB History   Grav Para Term Preterm Abortions TAB SAB Ect Mult Living   0              Review of Systems  Constitutional: Negative for fever.  HENT: Positive for sore throat.   Respiratory: Negative for chest tightness and shortness of breath.   Cardiovascular: Negative for chest pain.  Gastrointestinal: Positive for vomiting. Negative for nausea and abdominal pain.  Genitourinary: Negative for dysuria.  Musculoskeletal: Negative for back pain.  Neurological: Negative for headaches.  All other systems reviewed and are negative.     Allergies  Ceclor  Home Medications   Prior to Admission medications   Medication Sig Start Date End Date Taking? Authorizing Provider  citalopram (CELEXA) 20 MG tablet Take 20 mg by mouth daily.  08/16/11   Historical Provider, MD  clonazePAM (KLONOPIN) 0.5 MG tablet Take 0.5 mg by mouth daily.  07/15/11   Historical Provider, MD  Dapagliflozin Propanediol (FARXIGA) 5 MG TABS Take by mouth.    Historical Provider, MD  fexofenadine (ALLEGRA) 180 MG tablet Take 180 mg by mouth  daily.    Historical Provider, MD  JANUMET 50-500 MG per tablet Take 1 tablet by mouth daily.  08/11/11   Historical Provider, MD  lidocaine (XYLOCAINE) 2 % solution Use as directed 15 mLs in the mouth or throat every 4 (four) hours as needed (sore throat). Swish and swallow.  Do not use more than every 4 hrs as needed for pain 12/06/13   Merryl Hacker, MD  metroNIDAZOLE (FLAGYL) 500 MG tablet Take 1 tablet (500 mg total) by mouth 2 (two) times daily. 11/27/13   Terrance Mass, MD  omeprazole (PRILOSEC) 20 MG capsule Take 1 capsule (20 mg total) by mouth daily. 12/06/13   Merryl Hacker, MD  ondansetron (ZOFRAN) 4 MG tablet Take 1 tablet (4 mg total) by mouth every 6 (six) hours. 12/06/13   Merryl Hacker, MD   oxyCODONE-acetaminophen (PERCOCET) 5-325 MG per tablet 1 to 2 tablets every 6 hours as needed for pain. 07/20/13   Harden Mo, MD  rosuvastatin (CRESTOR) 20 MG tablet Take 20 mg by mouth daily.    Historical Provider, MD  Vitamin D, Ergocalciferol, (DRISDOL) 50000 UNITS CAPS capsule Take 1 capsule (50,000 Units total) by mouth every 7 (seven) days. 11/28/13   Terrance Mass, MD   BP 127/58  Pulse 69  Temp(Src) 98 F (36.7 C) (Oral)  Resp 16  Wt 179 lb (81.194 kg)  SpO2 100% Physical Exam  Nursing note and vitals reviewed. Constitutional: She is oriented to person, place, and time. She appears well-developed and well-nourished. No distress.  HENT:  Head: Normocephalic and atraumatic.  Mouth/Throat: Oropharynx is clear and moist.  Neck: Neck supple.  No crepitus noted  Cardiovascular: Normal rate, regular rhythm and normal heart sounds.   No murmur heard. Pulmonary/Chest: Effort normal and breath sounds normal. No respiratory distress. She has no wheezes.  Abdominal: Soft. Bowel sounds are normal. There is no tenderness. There is no rebound.  Lymphadenopathy:    She has no cervical adenopathy.  Neurological: She is alert and oriented to person, place, and time.  Skin: Skin is warm and dry.  Psychiatric: She has a normal mood and affect.    ED Course  Procedures (including critical care time) Labs Review Labs Reviewed  CBC WITH DIFFERENTIAL - Abnormal; Notable for the following:    Hemoglobin 11.9 (*)    All other components within normal limits    Imaging Review Dg Chest 2 View  12/06/2013   CLINICAL DATA:  Vomiting.  Chest discomfort.  EXAM: CHEST  2 VIEW  COMPARISON:  None.  FINDINGS: Normal heart size and mediastinal contours. No acute infiltrate or edema. No effusion or pneumothorax. Notable mid thoracic dextroscoliosis. Lower cervical anterior discectomy and fusion.  IMPRESSION: No active cardiopulmonary disease.   Electronically Signed   By: Jorje Guild M.D.    On: 12/06/2013 00:38     EKG Interpretation None      MDM   Final diagnoses:  Mallory-Weiss tear  Hematemesis with nausea    Patient presents with one episode of hematemesis at 8 AM this morning. Subsequently she has had sore throat. She is tolerating by mouth. She is nontoxic on exam and vital signs are reassuring. Breath sounds are normal and she is in no acute distress. Screening hemoglobin 11.9 which is comparable to prior hemoglobins several years ago. Chest x-ray shows no evidence of pleural effusion or pneumomediastinum. Suspect patient may have Mallory-Weiss tear causing sensation of sore throat.  Patient was given a  GI cocktail with some relief of her symptoms. Patient is tolerating by mouth and has had no recurrent emesis since 8 AM. Have low suspicion at this time for acute emergency condition including Boerhaave syndrome. Discussed with patient my findings and suspicion that she may have a small esophageal tear. At this time I do not feel she needs emergent endoscopy. I will refer her to GI and she was instructed to call the GI office first thing in the morning for possible evaluation. Patient will be discharged home with Zofran, PPI, and viscous lidocaine to swish and swallow.  The patient was given strict return precautions if she has recurrence of hematemesis, new shortness of breath, chest pain or any other new symptoms.  After history, exam, and medical workup I feel the patient has been appropriately medically screened and is safe for discharge home. Pertinent diagnoses were discussed with the patient. Patient was given return precautions.     Merryl Hacker, MD 12/06/13 [phone]

## 2013-12-06 NOTE — Discharge Instructions (Signed)
Mallory-Weiss Syndrome Mallory-Weiss syndrome refers to bleeding from tears in the lining of the esophagus near where it meets the stomach. This is often caused by forceful vomiting, retching or coughing. This condition is often associated with alcoholism. Usually the bleeding stops by itself after 24 to 48 hours. Sometimes endoscopic or surgical treatment is needed. This condition is not usually fatal. SYMPTOMS  Vomiting of bright red or black coffee ground like material.  Black, tarry stools.  Low blood pressure causing you to feel faint or experience loss of consciousness. DIAGNOSIS  Definitive diagnosis is by endoscopy. Treatment is usually supportive. Persistent bleeding is uncommon. Sometimes cauterization or injection of epinephrine to stop the bleeding is used during the diagnostic endoscopy. Embolization (obstruction) of the arteries supplying the area of bleeding is sometimes used to stop the bleeding.  An NG tube (naso-gastric tube) may be inserted to determine where the bleeding is coming from.  Often an EGD (esophagogastroduodenoscopy) is done. In this procedure there is a small flexible tube-like telescope (endoscope) put into your mouth, through your esophagus (the food tube leading from your mouth to your stomach), down into your stomach and into the small bowel. Through this your caregiver can see what and where the problem is. TREATMENT  It is necessary to stop the bleeding as soon as possible. During the EGD, your caregiver may inject medication into bleeding vessels to clot them.  SEEK IMMEDIATE MEDICAL CARE IF:  You have persistent dizziness, lightheadedness, or fainting.  Your vomiting returns and you have blood in your stools.  You have vomit that is bright red blood or black coffee ground-like blood, bright red blood in the stool or black tarry stools.  You have chest pain.  You cannot eat or drink.  You have nausea or vomiting. MAKE SURE YOU:   Understand  these instructions.  Will watch your condition.  Will get help right away if you are not doing well or get worse. Document Released: 10/11/2005 Document Revised: 08/16/2011 Document Reviewed: 11/15/2005 [facility] Patient Information 2015 [place], [place]. This information is not intended to replace advice given to you by your health care provider. Make sure you discuss any questions you have with your health care provider.

## 2014-01-29 ENCOUNTER — Other Ambulatory Visit: Payer: Self-pay | Admitting: Gastroenterology

## 2014-02-22 ENCOUNTER — Other Ambulatory Visit: Payer: 59

## 2014-02-22 DIAGNOSIS — E559 Vitamin D deficiency, unspecified: Secondary | ICD-10-CM

## 2014-02-23 LAB — VITAMIN D 25 HYDROXY (VIT D DEFICIENCY, FRACTURES): Vit D, 25-Hydroxy: 52 ng/mL (ref 30–89)

## 2014-08-27 ENCOUNTER — Encounter: Payer: Self-pay | Admitting: Gynecology

## 2014-08-27 ENCOUNTER — Ambulatory Visit (INDEPENDENT_AMBULATORY_CARE_PROVIDER_SITE_OTHER): Payer: Managed Care, Other (non HMO) | Admitting: Gynecology

## 2014-08-27 VITALS — Ht 61.25 in | Wt 182.0 lb

## 2014-08-27 DIAGNOSIS — L292 Pruritus vulvae: Secondary | ICD-10-CM

## 2014-08-27 DIAGNOSIS — Z8639 Personal history of other endocrine, nutritional and metabolic disease: Secondary | ICD-10-CM | POA: Diagnosis not present

## 2014-08-27 DIAGNOSIS — A499 Bacterial infection, unspecified: Secondary | ICD-10-CM | POA: Diagnosis not present

## 2014-08-27 DIAGNOSIS — N760 Acute vaginitis: Secondary | ICD-10-CM

## 2014-08-27 DIAGNOSIS — N951 Menopausal and female climacteric states: Secondary | ICD-10-CM | POA: Diagnosis not present

## 2014-08-27 DIAGNOSIS — B9689 Other specified bacterial agents as the cause of diseases classified elsewhere: Secondary | ICD-10-CM

## 2014-08-27 DIAGNOSIS — Z01419 Encounter for gynecological examination (general) (routine) without abnormal findings: Secondary | ICD-10-CM

## 2014-08-27 LAB — WET PREP FOR TRICH, YEAST, CLUE
Trich, Wet Prep: NONE SEEN
YEAST WET PREP: NONE SEEN

## 2014-08-27 MED ORDER — METRONIDAZOLE 500 MG PO TABS: 500.0000 mg | ORAL_TABLET | Freq: Two times a day (BID) | ORAL | Status: DC

## 2014-08-27 MED ORDER — ESTRADIOL 1 MG PO TABS: 1.0000 mg | ORAL_TABLET | Freq: Every day | ORAL | Status: DC

## 2014-08-27 MED ORDER — EST ESTROGENS-METHYLTEST 0.625-1.25 MG PO TABS: 1.0000 | ORAL_TABLET | Freq: Every day | ORAL | Status: DC

## 2014-08-27 NOTE — Progress Notes (Signed)
Janet Kelley [birth year] [id number]   History:    54 y.o.  for annual gyn exam who was complaining of vulvar pruritus. Patient's a type II diabetic and is being monitored by her PCP who is been doing her blood work. Several years ago patient had history vitamin D deficiency and she does not believe that her PCP recently checked at. She is currently taking vitamin D 2000 units daily. Patient a few years ago was diagnosed been in the menopause. She took Premarin for a few months then stopped and last year she was placed on transdermal estrogen patch and she stopped and now her vasomotor symptoms consisting of hot flashes and night sweats are getting to her this affecting her quality of life that she would like to begin it once again and stay on it for a longer period of time.She is status post LAVH done for endometriosis and fibroids. She was treated with cryosurgery for cervical dysplasia but has had normal Pap smears for greater than 20 years. Her last Pap smear was 2012. Patient with past history of colon polyps. Her last colonoscopy was in 2012. She had a normal bone density study in 2014.  Past medical history,surgical history, family history and social history were all reviewed and documented in the EPIC chart.  Gynecologic History No LMP recorded. Patient has had a hysterectomy. Contraception: status post hysterectomy Last Pap: 2012. Results were: normal Last mammogram: Today. Results were: Results pending  Obstetric History OB History  Gravida Para Term Preterm AB SAB TAB Ectopic Multiple Living  0                  ROS: A ROS was performed and pertinent positives and negatives are included in the history.  GENERAL: No fevers or chills. HEENT: No change in vision, no earache, sore throat or sinus congestion. NECK: No pain or stiffness. CARDIOVASCULAR: No chest pain or pressure. No palpitations. PULMONARY: No shortness of breath, cough or wheeze. GASTROINTESTINAL: No abdominal  pain, nausea, vomiting or diarrhea, melena or bright red blood per rectum. GENITOURINARY: No urinary frequency, urgency, hesitancy or dysuria. MUSCULOSKELETAL: No joint or muscle pain, no back pain, no recent trauma. DERMATOLOGIC: No rash, no itching, no lesions. ENDOCRINE: No polyuria, polydipsia, no heat or cold intolerance. No recent change in weight. HEMATOLOGICAL: No anemia or easy bruising or bleeding. NEUROLOGIC: No headache, seizures, numbness, tingling or weakness. PSYCHIATRIC: No depression, no loss of interest in normal activity or change in sleep pattern.     Exam: chaperone present  Ht 5' 1.25" (1.556 m)  Wt 182 lb (82.555 kg)  BMI 34.10 kg/m2  Body mass index is 34.1 kg/(m^2).  General appearance : Well developed well nourished female. No acute distress HEENT: Eyes: no retinal hemorrhage or exudates,  Neck supple, trachea midline, no carotid bruits, no thyroidmegaly Lungs: Clear to auscultation, no rhonchi or wheezes, or rib retractions  Heart: Regular rate and rhythm, no murmurs or gallops Breast:Examined in sitting and supine position were symmetrical in appearance, no palpable masses or tenderness,  no skin retraction, no nipple inversion, no nipple discharge, no skin discoloration, no axillary or supraclavicular lymphadenopathy Abdomen: no palpable masses or tenderness, no rebound or guarding Extremities: no edema or skin discoloration or tenderness  Pelvic:  Bartholin, Urethra, Skene Glands: Within normal limits             Vagina: No gross lesions or discharge  Cervix: Absent  Uterus absent  Adnexa  Without masses or tenderness  Anus and perineum  normal   Rectovaginal  normal sphincter tone without palpated masses or tenderness             Hemoccult cards will be provided   Wet prep: Rare clue cells, rare WBC, many bacteria  Assessment/Plan:  54 y.o. female for annual exam with past history vitamin D deficiency will check a vitamin D level today. For patient's  bacterial vaginosis she'll be prescribed Flagyl 500 mg one by mouth twice a day for 7 days. Patient to schedule her bone density study here in office in the next few weeks. She was reminded do her monthly breast exam. She was provided with fecal Hemoccult card to submit to the office for testing. Pap smear no longer needed according to the new guidelines. PCP we'll be doing her blood work.   Terrance Mass MD, 5:03 PM 08/27/2014

## 2014-08-27 NOTE — Patient Instructions (Addendum)
Bone Densitometry Bone densitometry is a special X-ray that measures your bone density and can be used to help predict your risk of bone fractures. This test is used to determine bone mineral content and density to diagnose osteoporosis. Osteoporosis is the loss of bone that may cause the bone to become weak. Osteoporosis commonly occurs in women entering menopause. However, it may be found in men and in people with other diseases. PREPARATION FOR TEST No preparation necessary. WHO SHOULD BE TESTED? All women older than 13. Postmenopausal women (50 to 70) with risk factors for osteoporosis. People with a previous fracture caused by normal activities. People with a small body frame (less than 127 poundsor a body mass index [BMI] of less than 21). People who have a parent with a hip fracture or history of osteoporosis. People who smoke. People who have rheumatoid arthritis. Anyone who engages in excessive alcohol use (more than 3 drinks most days). Women who experience early menopause. WHEN SHOULD YOU BE RETESTED? Current guidelines suggest that you should wait at least 2 years before doing a bone density test again if your first test was normal.Recent studies indicated that women with normal bone density may be able to wait a few years before needing to repeat a bone density test. You should discuss this with your caregiver.  NORMAL FINDINGS  Normal: less than standard deviation below normal (greater than -1). Osteopenia: 1 to 2.5 standard deviations below normal (-1 to -2.5). Osteoporosis: greater than 2.5 standard deviations below normal (less than -2.5). Test results are reported as a "T score" and a "Z score."The T score is a number that compares your bone density with the bone density of healthy, young women.The Z score is a number that compares your bone density with the scores of women who are the same age, gender, and race.  Ranges for normal findings may vary among different  laboratories and hospitals. You should always check with your doctor after having lab work or other tests done to discuss the meaning of your test results and whether your values are considered within normal limits. MEANING OF TEST  Your caregiver will go over the test results with you and discuss the importance and meaning of your results, as well as treatment options and the need for additional tests if necessary. OBTAINING THE TEST RESULTS It is your responsibility to obtain your test results. Ask the lab or department performing the test when and how you will get your results. Document Released: 06/15/2004 Document Revised: 08/16/2011 Document Reviewed: 07/08/2010 [facility] Patient Information 2015 [place], [place]. This information is not intended to replace advice given to you by your health care provider. Make sure you discuss any questions you have with your health care provider. Esterified Estrogens; Methyltestosterone tablets What is this medicine? ESTERIFIED ESTROGENS; METHYLTESTOSTERONE (es TAIR i fyed ES troe jenz; meth il tes TOS te rone) is a combination of hormones. This medicine is used to treat some of the symptoms of menopause like hot flashes and vaginal dryness. This medicine may be used for other purposes; ask your health care provider or pharmacist if you have questions. COMMON BRAND NAME(S): Covaryx, Covaryx H.S., EEMT, EEMT HS, Essian, Essian HS, Estratest, Estratest HS, Syntest DS, Syntest HS What should I tell my health care provider before I take this medicine? They need to know if you have any of these conditions: -abnormal vaginal bleeding -blood vessel disease or blood clots -breast, cervical, endometrial, ovarian, liver, or uterine cancer -dementia -diabetes -gallbladder disease -heart disease  or recent heart attack -high blood pressure -high cholesterol -high level of calcium in the blood -hysterectomy -kidney disease -liver disease -migraine  headaches -stroke -systemic lupus erythematosus (SLE) -tobacco smoker -vaginal bleeding -an unusual or allergic reaction to estrogens, other hormones, medicines, foods, dyes, or preservatives -pregnant or trying to get pregnant -breast-feeding How should I use this medicine? Take this medicine by mouth with a glass of water. To reduce nausea, this medicine may be taken with food. Follow the directions on the prescription label. Take this medicine at the same time each day. Do not take your medicine more often than directed. Talk to your pediatrician regarding the use of this medicine in children. Special care may be needed. A patient package insert for the product will be given with each prescription and refill. Read this sheet carefully each time. The sheet may change frequently. Overdosage: If you think you have taken too much of this medicine contact a poison control center or emergency room at once. NOTE: This medicine is only for you. Do not share this medicine with others. What if I miss a dose? If you miss a dose, take it as soon as you can. If it is almost time for your next dose, take only that dose. Do not take double or extra doses. What may interact with this medicine? Do not take this medicine with any of the following medications: -medicines for cancer like aminoglutethimide, anastrozole, exemestane, letrozole, testolactone, vorozole This medicine may also interact with the following medications: -antibiotics like erythromycin, clarithromycin -carbamazepine -female hormones, like estrogens or progestins and birth control pills -grapefruit juice -herbal remedies for menopause or female problems -insulin -itraconazole -ketoconazole -medicines that treat or prevent blood clots like warfarin -oxyphenbutazone -phenobarbital -rifampin -ritonavir -St. John's Wort This list may not describe all possible interactions. Give your health care provider a list of all the medicines,  herbs, non-prescription drugs, or dietary supplements you use. Also tell them if you smoke, drink alcohol, or use illegal drugs. Some items may interact with your medicine. What should I watch for while using this medicine? Visit your doctor or health care professional for regular checks on your progress. You will need a regular breast and pelvic exam and Pap smear while on this medicine. You should also discuss the need for regular mammograms with your health care professional, and follow his or her guidelines for these tests. This medicine can make your body retain fluid, making your fingers, hands, or ankles swell. Your blood pressure can go up. Contact your doctor or health care professional if you feel you are retaining fluid. If you have any reason to think you are pregnant, stop taking this medicine right away and contact your doctor or health care professional. Smoking increases the risk of getting a blood clot or having a stroke while you are taking this medicine, especially if you are more than 53 years old. You are strongly advised not to smoke. If you wear contact lenses and notice visual changes, or if the lenses begin to feel uncomfortable, consult your eye doctor or health care professional. This medicine can increase the risk of developing a condition (endometrial hyperplasia) that may lead to cancer of the lining of the uterus. Taking progestins, another hormone drug, with this medicine lowers the risk of developing this condition. Therefore, if your uterus has not been removed (by a hysterectomy), your doctor may prescribe a progestin for you to take together with your estrogen. You should know, however, that taking estrogens with progestins  may have additional health risks. You should discuss the use of estrogens and progestins with your health care professional to determine the benefits and risks for you. If you are going to have surgery, you may need to stop taking this medicine. Consult  your health care professional for advice before you schedule the surgery. What side effects may I notice from receiving this medicine? Side effects that you should report to your doctor or health care professional as soon as possible: -allergic reactions like skin rash, itching or hives, swelling of the face, lips, or tongue -breast tissue changes or discharge -changes in vision -chest pain -confusion, trouble speaking or understanding -dark urine -general ill feeling or flu-like symptoms -light-colored stools -nausea, vomiting -pain, swelling, warmth in the leg -right upper belly pain -severe headaches -shortness of breath -sudden numbness or weakness of the face, arm or leg -trouble walking, dizziness, loss of balance or coordination -unusual vaginal bleeding -yellowing of the eyes or skin Side effects that usually do not require medical attention (report to your doctor or health care professional if they continue or are bothersome): -hair loss -increased hunger or thirst -increased urination -symptoms of vaginal infection like itching, irritation or unusual discharge -unusually weak or tired This list may not describe all possible side effects. Call your doctor for medical advice about side effects. You may report side effects to FDA at 1-800-FDA-1088. Where should I keep my medicine? Keep out of the reach of children. Store at room temperature between 15 and 30 degrees C (59 and 86 degrees F). Throw away any unused medicine after the expiration date. NOTE: This sheet is a summary. It may not cover all possible information. If you have questions about this medicine, talk to your doctor, pharmacist, or health care provider.  2015, [employer]. (2008-05-09 11:46:40) Hormone Therapy At menopause, your body begins making less estrogen and progesterone hormones. This causes the body to stop having menstrual periods. This is because estrogen and progesterone hormones control  your periods and menstrual cycle. A lack of estrogen may cause symptoms such as:  Hot flushes (or hot flashes).  Vaginal dryness.  Dry skin.  Loss of sex drive.  Risk of bone loss (osteoporosis). When this happens, you may choose to take hormone therapy to get back the estrogen lost during menopause. When the hormone estrogen is given alone, it is usually referred to as ET (Estrogen Therapy). When the hormone progestin is combined with estrogen, it is generally called HT (Hormone Therapy). This was formerly known as hormone replacement therapy (HRT). Your caregiver can help you make a decision on what will be best for you. The decision to use HT seems to change often as new studies are done. Many studies do not agree on the benefits of hormone replacement therapy. LIKELY BENEFITS OF HT INCLUDE PROTECTION FROM:  Hot Flushes (also called hot flashes) - A hot flush is a sudden feeling of heat that spreads over the face and body. The skin may redden like a blush. It is connected with sweats and sleep disturbance. Women going through menopause may have hot flushes a few times a month or several times per day depending on the woman.  Osteoporosis (bone loss)- Estrogen helps guard against bone loss. After menopause, a woman's bones slowly lose calcium and become weak and brittle. As a result, bones are more likely to break. The hip, wrist, and spine are affected most often. Hormone therapy can help slow bone loss after menopause. Weight bearing exercise and taking  calcium with vitamin D also can help prevent bone loss. There are also medications that your caregiver can prescribe that can help prevent osteoporosis.  Vaginal Dryness - Loss of estrogen causes changes in the vagina. Its lining may become thin and dry. These changes can cause pain and bleeding during sexual intercourse. Dryness can also lead to infections. This can cause burning and itching. (Vaginal estrogen treatment can help relieve pain,  itching, and dryness.)  Urinary Tract Infections are more common after menopause because of lack of estrogen. Some women also develop urinary incontinence because of low estrogen levels in the vagina and bladder.  Possible other benefits of estrogen include a positive effect on mood and short-term memory in women. RISKS AND COMPLICATIONS  Using estrogen alone without progesterone causes the lining of the uterus to grow. This increases the risk of lining of the uterus (endometrial) cancer. Your caregiver should give another hormone called progestin if you have a uterus.  Women who take combined (estrogen and progestin) HT appear to have an increased risk of breast cancer. The risk appears to be small, but increases throughout the time that HT is taken.  Combined therapy also makes the breast tissue slightly denser which makes it harder to read mammograms (breast X-rays).  Combined, estrogen and progesterone therapy can be taken together every day, in which case there may be spotting of blood. HT therapy can be taken cyclically in which case you will have menstrual periods. Cyclically means HT is taken for a set amount of days, then not taken, then this process is repeated.  HT may increase the risk of stroke, heart attack, breast cancer and forming blood clots in your leg.  Transdermal estrogen (estrogen that is absorbed through the skin with a patch or a cream) may have more positive results with:  Cholesterol.  Blood pressure.  Blood clots. Having the following conditions may indicate you should not have HT:  Endometrial cancer.  Liver disease.  Breast cancer.  Heart disease.  History of blood clots.  Stroke. TREATMENT   If you choose to take HT and have a uterus, usually estrogen and progestin are prescribed.  Your caregiver will help you decide the best way to take the medications.  Possible ways to take estrogen  include:  Pills.  Patches.  Gels.  Sprays.  Vaginal estrogen cream, rings and tablets.  It is best to take the lowest dose possible that will help your symptoms and take them for the shortest period of time that you can.  Hormone therapy can help relieve some of the problems (symptoms) that affect women at menopause. Before making a decision about HT, talk to your caregiver about what is best for you. Be well informed and comfortable with your decisions. HOME CARE INSTRUCTIONS   Follow your caregivers advice when taking the medications.  A Pap test is done to screen for cervical cancer.  The first Pap test should be done at age 74.  Between ages 79 and 24, Pap tests are repeated every 2 years.  Beginning at age 76, you are advised to have a Pap test every 3 years as long as your past 3 Pap tests have been normal.  Some women have medical problems that increase the chance of getting cervical cancer. Talk to your caregiver about these problems. It is especially important to talk to your caregiver if a new problem develops soon after your last Pap test. In these cases, your caregiver may recommend more frequent screening and Pap  tests.  The above recommendations are the same for women who have or have not gotten the vaccine for HPV (Human Papillomavirus).  If you had a hysterectomy for a problem that was not a cancer or a condition that could lead to cancer, then you no longer need Pap tests. However, even if you no longer need a Pap test, a regular exam is a good idea to make sure no other problems are starting.   If you are between ages 101 and 64, and you have had normal Pap tests going back 10 years, you no longer need Pap tests. However, even if you no longer need a Pap test, a regular exam is a good idea to make sure no other problems are starting.   If you have had past treatment for cervical cancer or a condition that could lead to cancer, you need Pap tests and screening  for cancer for at least 20 years after your treatment.  If Pap tests have been discontinued, risk factors (such as a new sexual partner) need to be re-assessed to determine if screening should be resumed.  Some women may need screenings more often if they are at high risk for cervical cancer.  Get mammograms done as per the advice of your caregiver. SEEK IMMEDIATE MEDICAL CARE IF:  You develop abnormal vaginal bleeding.  You have pain or swelling in your legs, shortness of breath, or chest pain.  You develop dizziness or headaches.  You have lumps or changes in your breasts or armpits.  You have slurred speech.  You develop weakness or numbness of your arms or legs.  You have pain, burning, or bleeding when urinating.  You develop abdominal pain. Document Released: 02/20/2003 Document Revised: 08/16/2011 Document Reviewed: 06/10/2010 [facility] Patient Information 2015 [place], [place]. This information is not intended to replace advice given to you by your health care provider. Make sure you discuss any questions you have with your health care provider.  Bacterial Vaginosis Bacterial vaginosis is a vaginal infection that occurs when the normal balance of bacteria in the vagina is disrupted. It results from an overgrowth of certain bacteria. This is the most common vaginal infection in women of childbearing age. Treatment is important to prevent complications, especially in pregnant women, as it can cause a premature delivery. CAUSES  Bacterial vaginosis is caused by an increase in harmful bacteria that are normally present in smaller amounts in the vagina. Several different kinds of bacteria can cause bacterial vaginosis. However, the reason that the condition develops is not fully understood. RISK FACTORS Certain activities or behaviors can put you at an increased risk of developing bacterial vaginosis, including:  Having a new sex partner or multiple sex  partners.  Douching.  Using an intrauterine device (IUD) for contraception. Women do not get bacterial vaginosis from toilet seats, bedding, swimming pools, or contact with objects around them. SIGNS AND SYMPTOMS  Some women with bacterial vaginosis have no signs or symptoms. Common symptoms include:  Grey vaginal discharge.  A fishlike odor with discharge, especially after sexual intercourse.  Itching or burning of the vagina and vulva.  Burning or pain with urination. DIAGNOSIS  Your health care provider will take a medical history and examine the vagina for signs of bacterial vaginosis. A sample of vaginal fluid may be taken. Your health care provider will look at this sample under a microscope to check for bacteria and abnormal cells. A vaginal pH test may also be done.  TREATMENT  Bacterial vaginosis may  be treated with antibiotic medicines. These may be given in the form of a pill or a vaginal cream. A second round of antibiotics may be prescribed if the condition comes back after treatment.  HOME CARE INSTRUCTIONS   Only take over-the-counter or prescription medicines as directed by your health care provider.  If antibiotic medicine was prescribed, take it as directed. Make sure you finish it even if you start to feel better.  Do not have sex until treatment is completed.  Tell all sexual partners that you have a vaginal infection. They should see their health care provider and be treated if they have problems, such as a mild rash or itching.  Practice safe sex by using condoms and only having one sex partner. SEEK MEDICAL CARE IF:   Your symptoms are not improving after 3 days of treatment.  You have increased discharge or pain.  You have a fever. MAKE SURE YOU:   Understand these instructions.  Will watch your condition.  Will get help right away if you are not doing well or get worse. FOR MORE INFORMATION  Centers for Disease Control and Prevention, Division of  STD Prevention: [employer] American Sexual Health Association (ASHA): www.ashastd.org  Document Released: 05/24/2005 Document Revised: 03/14/2013 Document Reviewed: 01/03/2013 [facility] Patient Information 2015 [place], [place]. This information is not intended to replace advice given to you by your health care provider. Make sure you discuss any questions you have with your health care provider.

## 2014-08-28 ENCOUNTER — Encounter: Payer: Self-pay | Admitting: Gynecology

## 2014-08-28 LAB — VITAMIN D 25 HYDROXY (VIT D DEFICIENCY, FRACTURES): Vit D, 25-Hydroxy: 32 ng/mL (ref 30–100)

## 2014-09-06 ENCOUNTER — Encounter: Payer: Self-pay | Admitting: Gynecology

## 2014-09-17 ENCOUNTER — Ambulatory Visit (INDEPENDENT_AMBULATORY_CARE_PROVIDER_SITE_OTHER): Payer: Managed Care, Other (non HMO)

## 2014-09-17 ENCOUNTER — Other Ambulatory Visit: Payer: Self-pay | Admitting: Gynecology

## 2014-09-17 DIAGNOSIS — Z13820 Encounter for screening for osteoporosis: Secondary | ICD-10-CM

## 2014-09-17 DIAGNOSIS — N951 Menopausal and female climacteric states: Secondary | ICD-10-CM

## 2015-05-09 ENCOUNTER — Ambulatory Visit (INDEPENDENT_AMBULATORY_CARE_PROVIDER_SITE_OTHER): Payer: Managed Care, Other (non HMO) | Admitting: Gynecology

## 2015-05-09 ENCOUNTER — Encounter: Payer: Self-pay | Admitting: Gynecology

## 2015-05-09 VITALS — BP 120/86 | Ht 61.0 in | Wt 182.0 lb

## 2015-05-09 DIAGNOSIS — Z8639 Personal history of other endocrine, nutritional and metabolic disease: Secondary | ICD-10-CM

## 2015-05-09 DIAGNOSIS — N951 Menopausal and female climacteric states: Secondary | ICD-10-CM | POA: Diagnosis not present

## 2015-05-09 LAB — TSH: TSH: 1.059 u[IU]/mL (ref 0.350–4.500)

## 2015-05-09 MED ORDER — EST ESTROGENS-METHYLTEST 1.25-2.5 MG PO TABS: 1.0000 | ORAL_TABLET | Freq: Every day | ORAL | Status: DC

## 2015-05-09 NOTE — Patient Instructions (Signed)
Esterified Estrogens; Methyltestosterone tablets What is this medicine? ESTERIFIED ESTROGENS; METHYLTESTOSTERONE (es TAIR i fyed ES troe jenz; meth il tes TOS te rone) is a combination of hormones. This medicine is used to treat some of the symptoms of menopause like hot flashes and vaginal dryness. This medicine may be used for other purposes; ask your health care provider or pharmacist if you have questions. What should I tell my health care provider before I take this medicine? They need to know if you have any of these conditions: -abnormal vaginal bleeding -blood vessel disease or blood clots -breast, cervical, endometrial, ovarian, liver, or uterine cancer -dementia -diabetes -gallbladder disease -heart disease or recent heart attack -high blood pressure -high cholesterol -high level of calcium in the blood -hysterectomy -kidney disease -liver disease -migraine headaches -stroke -systemic lupus erythematosus (SLE) -tobacco smoker -vaginal bleeding -an unusual or allergic reaction to estrogens, other hormones, medicines, foods, dyes, or preservatives -pregnant or trying to get pregnant -breast-feeding How should I use this medicine? Take this medicine by mouth with a glass of water. To reduce nausea, this medicine may be taken with food. Follow the directions on the prescription label. Take this medicine at the same time each day. Do not take your medicine more often than directed. Talk to your pediatrician regarding the use of this medicine in children. Special care may be needed. A patient package insert for the product will be given with each prescription and refill. Read this sheet carefully each time. The sheet may change frequently. Overdosage: If you think you have taken too much of this medicine contact a poison control center or emergency room at once. NOTE: This medicine is only for you. Do not share this medicine with others. What if I miss a dose? If you miss a  dose, take it as soon as you can. If it is almost time for your next dose, take only that dose. Do not take double or extra doses. What may interact with this medicine? Do not take this medicine with any of the following medications: -medicines for cancer like aminoglutethimide, anastrozole, exemestane, letrozole, testolactone, vorozole This medicine may also interact with the following medications: -antibiotics like erythromycin, clarithromycin -carbamazepine -female hormones, like estrogens or progestins and birth control pills -grapefruit juice -herbal remedies for menopause or female problems -insulin -itraconazole -ketoconazole -medicines that treat or prevent blood clots like warfarin -oxyphenbutazone -phenobarbital -rifampin -ritonavir -St. John's Wort This list may not describe all possible interactions. Give your health care provider a list of all the medicines, herbs, non-prescription drugs, or dietary supplements you use. Also tell them if you smoke, drink alcohol, or use illegal drugs. Some items may interact with your medicine. What should I watch for while using this medicine? Visit your doctor or health care professional for regular checks on your progress. You will need a regular breast and pelvic exam and Pap smear while on this medicine. You should also discuss the need for regular mammograms with your health care professional, and follow his or her guidelines for these tests. This medicine can make your body retain fluid, making your fingers, hands, or ankles swell. Your blood pressure can go up. Contact your doctor or health care professional if you feel you are retaining fluid. If you have any reason to think you are pregnant, stop taking this medicine right away and contact your doctor or health care professional. Smoking increases the risk of getting a blood clot or having a stroke while you are taking this medicine, especially  if you are more than 53 years old. You are  strongly advised not to smoke. If you wear contact lenses and notice visual changes, or if the lenses begin to feel uncomfortable, consult your eye doctor or health care professional. This medicine can increase the risk of developing a condition (endometrial hyperplasia) that may lead to cancer of the lining of the uterus. Taking progestins, another hormone drug, with this medicine lowers the risk of developing this condition. Therefore, if your uterus has not been removed (by a hysterectomy), your doctor may prescribe a progestin for you to take together with your estrogen. You should know, however, that taking estrogens with progestins may have additional health risks. You should discuss the use of estrogens and progestins with your health care professional to determine the benefits and risks for you. If you are going to have surgery, you may need to stop taking this medicine. Consult your health care professional for advice before you schedule the surgery. What side effects may I notice from receiving this medicine? Side effects that you should report to your doctor or health care professional as soon as possible: -allergic reactions like skin rash, itching or hives, swelling of the face, lips, or tongue -breast tissue changes or discharge -changes in vision -chest pain -confusion, trouble speaking or understanding -dark urine -general ill feeling or flu-like symptoms -light-colored stools -nausea, vomiting -pain, swelling, warmth in the leg -right upper belly pain -severe headaches -shortness of breath -sudden numbness or weakness of the face, arm or leg -trouble walking, dizziness, loss of balance or coordination -unusual vaginal bleeding -yellowing of the eyes or skin Side effects that usually do not require medical attention (report to your doctor or health care professional if they continue or are bothersome): -hair loss -increased hunger or thirst -increased urination -symptoms of  vaginal infection like itching, irritation or unusual discharge -unusually weak or tired This list may not describe all possible side effects. Call your doctor for medical advice about side effects. You may report side effects to FDA at 1-800-FDA-1088. Where should I keep my medicine? Keep out of the reach of children. Store at room temperature between 15 and 30 degrees C (59 and 86 degrees F). Throw away any unused medicine after the expiration date. NOTE: This sheet is a summary. It may not cover all possible information. If you have questions about this medicine, talk to your doctor, pharmacist, or health care provider.    2016, Elsevier/Gold Standard. (2008-05-09 11:46:40)

## 2015-05-09 NOTE — Progress Notes (Signed)
   Patient is a 54 year old who presented to the office today complaining of persistent hot flashes especially during the evening. She was seen the office on March 22 for her annual exam. Her PCP has been monitoring and treating her for type 2 diabetes. A few years ago was diagnosed been in the menopause. She took Premarin for a few months then stopped and in 2015 she was placed on transdermal estrogen patch and stop as well and then at last office visit she was started on Estratest 0.625 mg daily which has helped her symptoms but not completely resolved. She does not recall ever thyroid function tests have been tested she believes is been already year. She did mention that many years ago she had a small thyroid nodule which was aspirated was benign.  On examination today her thyroid felt normal there was no carotid bruits noted no thyromegaly  We had a discussion that Paps we may need to increase her Estratest from 0.625 up to 1.25 mg daily.She is status post LAVH done for endometriosis and fibroids. She was treated with cryosurgery for cervical dysplasia but has had normal Pap smears for greater than 20 years. Her last Pap smear was 2012. Her mammogram is up-to-date was normal March of this year.  Once again we discussed the risks benefits and pros and cons of hormone replacement therapy. Patient fully understands and accepts and will return back to the office in March of next year for her annual exam. We discussed importance of calcium vitamin D and regular exercise for osteoporosis prevention. We are going to check a TSH today as well.  Gravida 90% time was spent discussing vasomotor symptoms and normal replacement therapy with this patient. Time of consultation 15 minutes.

## 2015-05-22 ENCOUNTER — Telehealth: Payer: Self-pay | Admitting: *Deleted

## 2015-05-22 NOTE — Telephone Encounter (Signed)
Prior Authorization for Estratest 1.5-2.5 mg faxed form to [place], will wait for response.

## 2015-05-26 NOTE — Telephone Encounter (Signed)
Medication denied as it is not covered on her medication list formulary per [employer]

## 2015-06-27 ENCOUNTER — Other Ambulatory Visit: Payer: Self-pay | Admitting: Gastroenterology

## 2015-06-27 DIAGNOSIS — R109 Unspecified abdominal pain: Secondary | ICD-10-CM

## 2015-07-03 ENCOUNTER — Ambulatory Visit
Admission: RE | Admit: 2015-07-03 | Discharge: 2015-07-03 | Disposition: A | Discharge status: Home / Self Care | Payer: Managed Care, Other (non HMO) | Source: Ambulatory Visit | Attending: Gastroenterology | Admitting: Gastroenterology

## 2015-07-03 DIAGNOSIS — R109 Unspecified abdominal pain: Secondary | ICD-10-CM

## 2015-09-22 ENCOUNTER — Other Ambulatory Visit: Payer: Self-pay

## 2015-09-26 ENCOUNTER — Telehealth: Payer: Self-pay | Admitting: *Deleted

## 2015-09-26 MED ORDER — ESTRADIOL 1 MG PO TABS: 1.0000 mg | ORAL_TABLET | Freq: Every day | ORAL | Status: DC

## 2015-09-26 NOTE — Telephone Encounter (Signed)
Pt called stating her Estratest from 0.625 up to 1.25 mg is not a cover medication by her insurance. Pt has had estradiol 1 mg and past and done well with it, would like to switch to this Rx if possible. Annual schedule on /19/17. Please advise

## 2015-09-26 NOTE — Telephone Encounter (Signed)
Pt aware Rx sent. Annual scheduled on 10/24/15

## 2015-09-26 NOTE — Telephone Encounter (Signed)
Estrace 1 mg q daily will be fine instead # 30 refill x 11

## 2015-10-24 ENCOUNTER — Ambulatory Visit (INDEPENDENT_AMBULATORY_CARE_PROVIDER_SITE_OTHER): Payer: Managed Care, Other (non HMO) | Admitting: Gynecology

## 2015-10-24 ENCOUNTER — Encounter: Payer: Self-pay | Admitting: Gynecology

## 2015-10-24 VITALS — BP 130/84 | Ht 61.5 in | Wt 170.0 lb

## 2015-10-24 DIAGNOSIS — Z01419 Encounter for gynecological examination (general) (routine) without abnormal findings: Secondary | ICD-10-CM

## 2015-10-24 DIAGNOSIS — Z8639 Personal history of other endocrine, nutritional and metabolic disease: Secondary | ICD-10-CM | POA: Insufficient documentation

## 2015-10-24 DIAGNOSIS — Z79890 Hormone replacement therapy (postmenopausal): Secondary | ICD-10-CM | POA: Diagnosis not present

## 2015-10-24 MED ORDER — ESTRADIOL 1 MG PO TABS: 1.0000 mg | ORAL_TABLET | Freq: Every day | ORAL | Status: DC

## 2015-10-24 NOTE — Patient Instructions (Signed)
Colonoscopy A colonoscopy is an exam to look at the entire large intestine (colon). This exam can help find problems such as tumors, polyps, inflammation, and areas of bleeding. The exam takes about 1 hour.  LET YOUR HEALTH CARE PROVIDER KNOW ABOUT:   Any allergies you have.  All medicines you are taking, including vitamins, herbs, eye drops, creams, and over-the-counter medicines.  Previous problems you or members of your family have had with the use of anesthetics.  Any blood disorders you have.  Previous surgeries you have had.  Medical conditions you have. RISKS AND COMPLICATIONS  Generally, this is a safe procedure. However, as with any procedure, complications can occur. Possible complications include:  Bleeding.  Tearing or rupture of the colon wall.  Reaction to medicines given during the exam.  Infection (rare). BEFORE THE PROCEDURE   Ask your health care provider about changing or stopping your regular medicines.  You may be prescribed an oral bowel prep. This involves drinking a large amount of medicated liquid, starting the day before your procedure. The liquid will cause you to have multiple loose stools until your stool is almost clear or light green. This cleans out your colon in preparation for the procedure.  Do not eat or drink anything else once you have started the bowel prep, unless your health care provider tells you it is safe to do so.  Arrange for someone to drive you home after the procedure. PROCEDURE   You will be given medicine to help you relax (sedative).  You will lie on your side with your knees bent.  A long, flexible tube with a light and camera on the end (colonoscope) will be inserted through the rectum and into the colon. The camera sends video back to a computer screen as it moves through the colon. The colonoscope also releases carbon dioxide gas to inflate the colon. This helps your health care provider see the area better.  During  the exam, your health care provider may take a small tissue sample (biopsy) to be examined under a microscope if any abnormalities are found.  The exam is finished when the entire colon has been viewed. AFTER THE PROCEDURE   Do not drive for 24 hours after the exam.  You may have a small amount of blood in your stool.  You may pass moderate amounts of gas and have mild abdominal cramping or bloating. This is caused by the gas used to inflate your colon during the exam.  Ask when your test results will be ready and how you will get your results. Make sure you get your test results.   This information is not intended to replace advice given to you by your health care provider. Make sure you discuss any questions you have with your health care provider.   Document Released: 05/21/2000 Document Revised: 03/14/2013 Document Reviewed: 01/29/2013 Elsevier Interactive Patient Education 2016 Elsevier Inc.  

## 2015-10-24 NOTE — Progress Notes (Signed)
Janet Kelley 1961/01/26 [id number]   History:    55 y.o.  for annual gyn exam with no complaints today. Patient with prior history of laparoscopic-assisted vaginal hysterectomy secondary to endometriosis and fibroid uterus. Patient stated she was treated for cervical dysplasia via cryotherapy over 20 years ago no Pap smear is been normal since then. Patient is currently on Estrace 1 mg tablet daily and it has helped her vasomotor symptoms. Her PCP has been monitoring and treating her for type 2 diabetes. Patient had a colonoscopy in 2012 benign polyp was removed and she is in the process of scheduling her colonoscopy. She had a normal bone density study in 2016. Patient with past history vitamin D deficiency is taking her calcium and vitamin D daily.  Past medical history,surgical history, family history and social history were all reviewed and documented in the EPIC chart.  Gynecologic History No LMP recorded. Patient has had a hysterectomy. Contraception: status post hysterectomy Last Pap: 2012. Results were: normal Last mammogram: 2016. Results were: normal  Obstetric History OB History  Gravida Para Term Preterm AB SAB TAB Ectopic Multiple Living  0                  ROS: A ROS was performed and pertinent positives and negatives are included in the history.  GENERAL: No fevers or chills. HEENT: No change in vision, no earache, sore throat or sinus congestion. NECK: No pain or stiffness. CARDIOVASCULAR: No chest pain or pressure. No palpitations. PULMONARY: No shortness of breath, cough or wheeze. GASTROINTESTINAL: No abdominal pain, nausea, vomiting or diarrhea, melena or bright red blood per rectum. GENITOURINARY: No urinary frequency, urgency, hesitancy or dysuria. MUSCULOSKELETAL: No joint or muscle pain, no back pain, no recent trauma. DERMATOLOGIC: No rash, no itching, no lesions. ENDOCRINE: No polyuria, polydipsia, no heat or cold intolerance. No recent change in weight.  HEMATOLOGICAL: No anemia or easy bruising or bleeding. NEUROLOGIC: No headache, seizures, numbness, tingling or weakness. PSYCHIATRIC: No depression, no loss of interest in normal activity or change in sleep pattern.     Exam: chaperone present  BP 130/84 mmHg  Ht 5' 1.5" (1.562 m)  Wt 170 lb (77.111 kg)  BMI 31.60 kg/m2  Body mass index is 31.6 kg/(m^2).  General appearance : Well developed well nourished female. No acute distress HEENT: Eyes: no retinal hemorrhage or exudates,  Neck supple, trachea midline, no carotid bruits, no thyroidmegaly Lungs: Clear to auscultation, no rhonchi or wheezes, or rib retractions  Heart: Regular rate and rhythm, no murmurs or gallops Breast:Examined in sitting and supine position were symmetrical in appearance, no palpable masses or tenderness,  no skin retraction, no nipple inversion, no nipple discharge, no skin discoloration, no axillary or supraclavicular lymphadenopathy Abdomen: no palpable masses or tenderness, no rebound or guarding Extremities: no edema or skin discoloration or tenderness  Pelvic:  Bartholin, Urethra, Skene Glands: Within normal limits             Vagina: No gross lesions or discharge  Cervix: Absent  Uterus  absent  Adnexa  Without masses or tenderness  Anus and perineum  normal   Rectovaginal  normal sphincter tone without palpated masses or tenderness             Hemoccult PCP provides     Assessment/Plan:  55 y.o. female for annual exam doing well on Estrace 1 mg tablet daily for vasomotor symptoms. Pap smear no longer indicated according to new guidelines. Patient will be  scheduling her colonoscopy and her mammogram. Because of her past history vitamin D deficiency were going to check her vitamin D level today.   Terrance Mass MD, 3:55 PM 10/24/2015

## 2015-10-25 LAB — VITAMIN D 25 HYDROXY (VIT D DEFICIENCY, FRACTURES): VIT D 25 HYDROXY: 43 ng/mL (ref 30–100)

## 2015-11-14 ENCOUNTER — Encounter: Payer: Self-pay | Admitting: Gynecology

## 2015-11-25 ENCOUNTER — Other Ambulatory Visit: Payer: Self-pay | Admitting: Gastroenterology

## 2016-04-08 IMAGING — US US ABDOMEN COMPLETE
1 series · 14 of 25 positions shown · non-contrast
Comparison: 04/01/2011 .

CLINICAL DATA: Left upper quadrant pain .

EXAM:
ABDOMEN ULTRASOUND COMPLETE

[Series 1: us abdomen complete · 0.24mm/px · 14 of 77 slices shown]
[im 1/77]
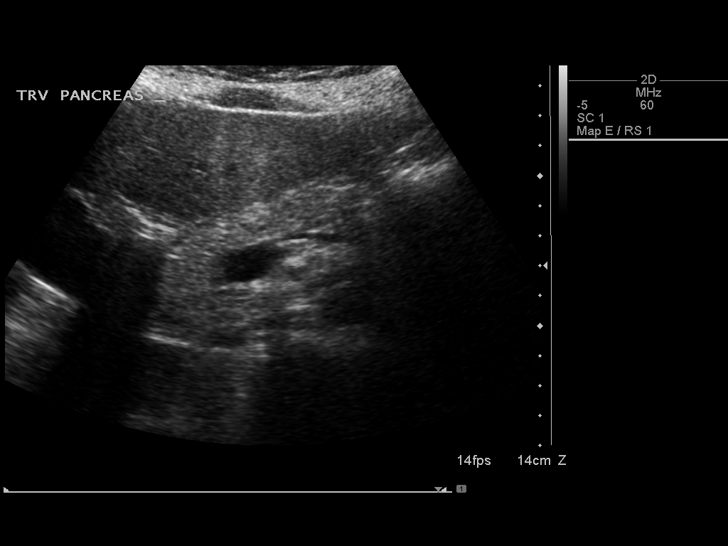
[im 7/77]
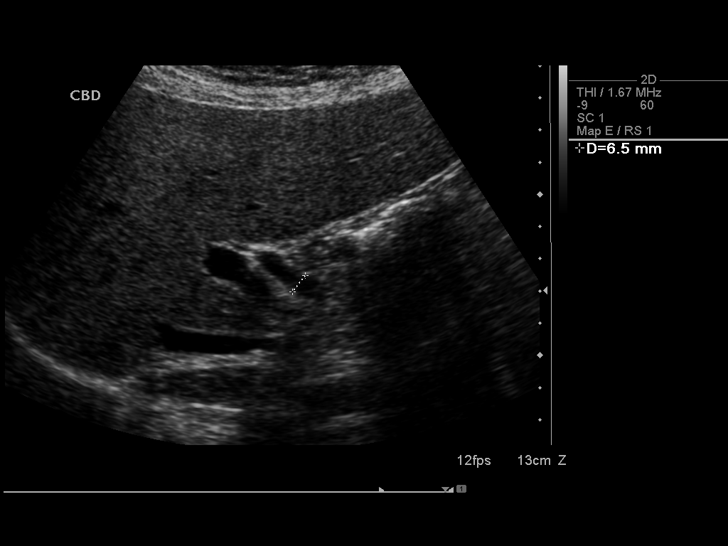
[im 13/77]
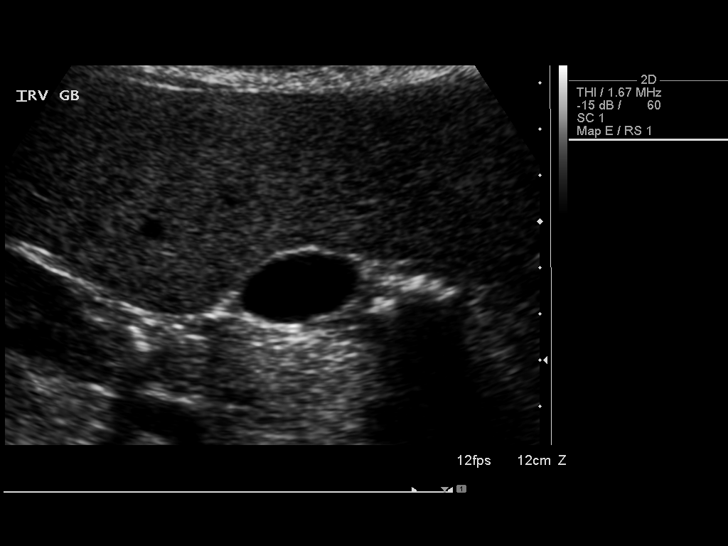
[im 20/77]
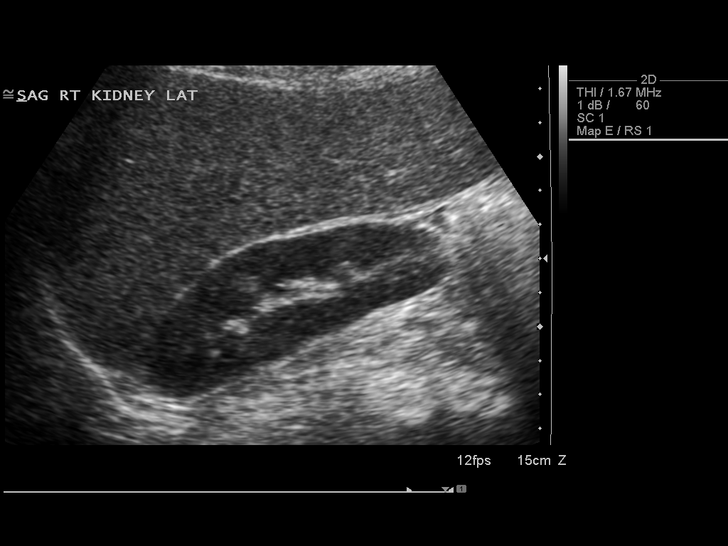
[im 26/77]
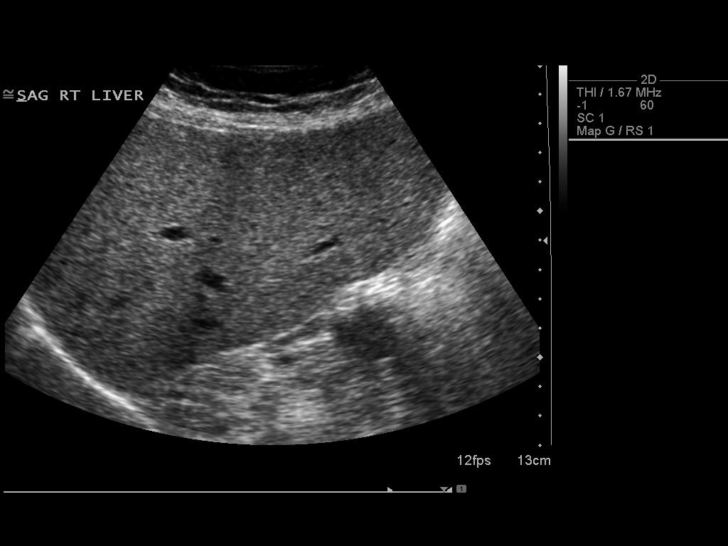
[im 29/77]
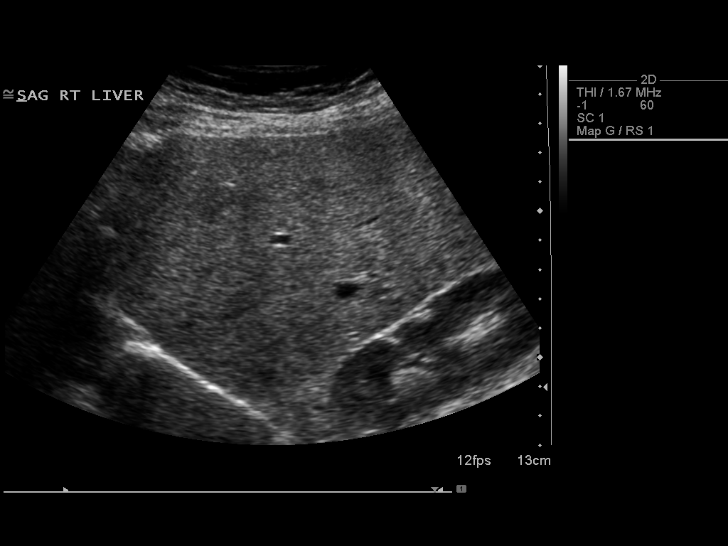
[im 35/77]
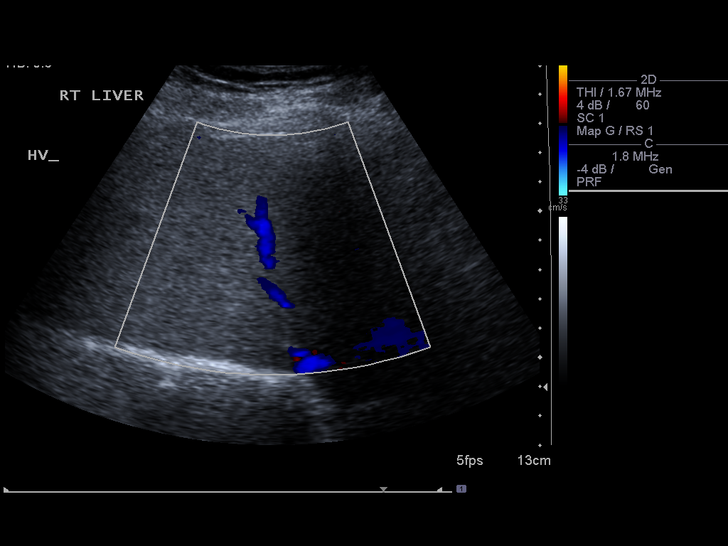
[im 42/77]
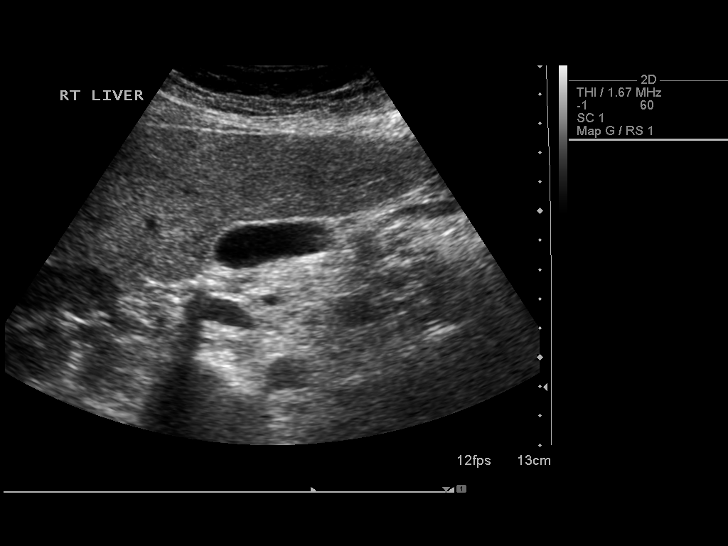
[im 48/77]
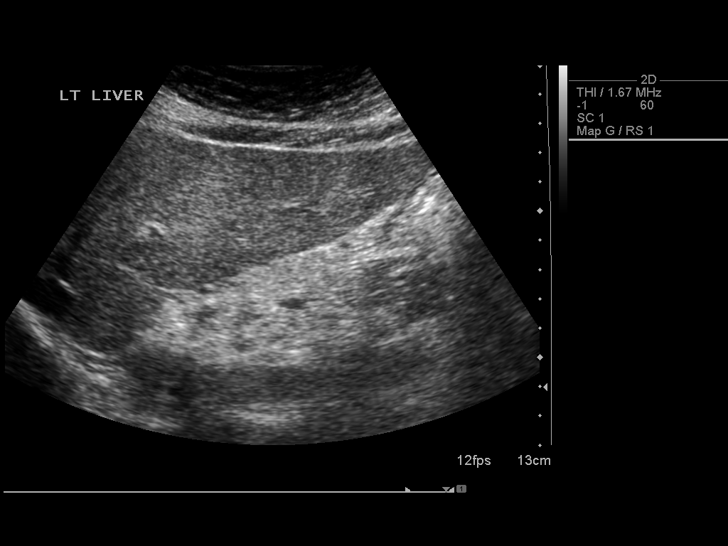
[im 51/77]
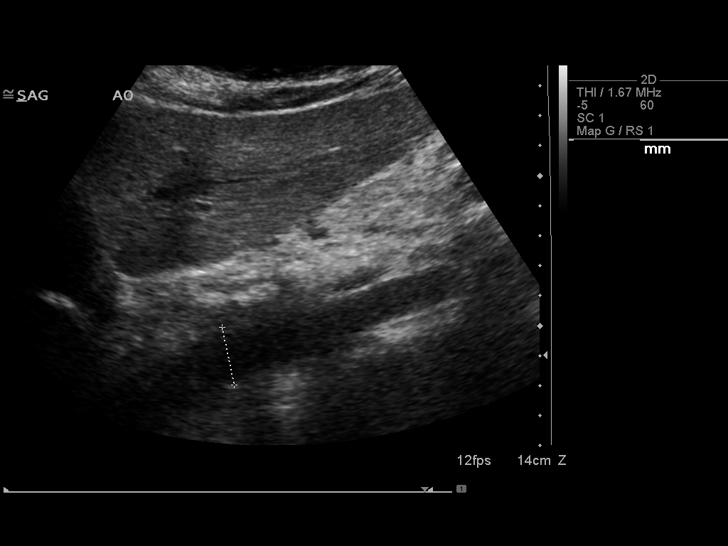
[im 58/77]
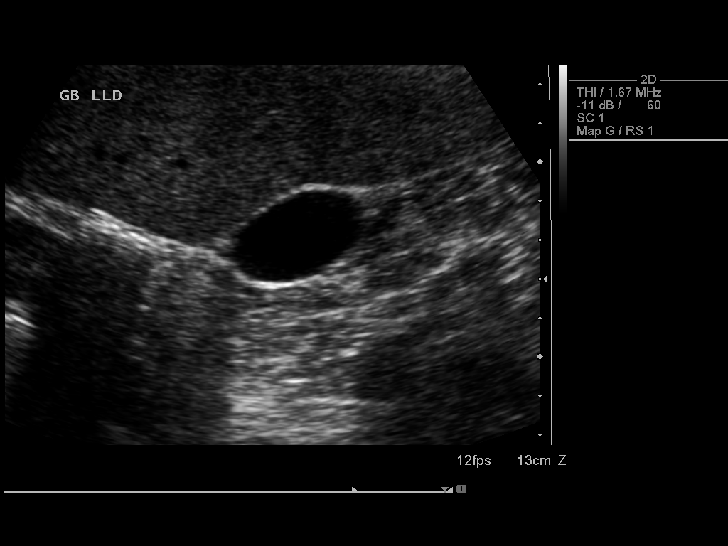
[im 64/77]
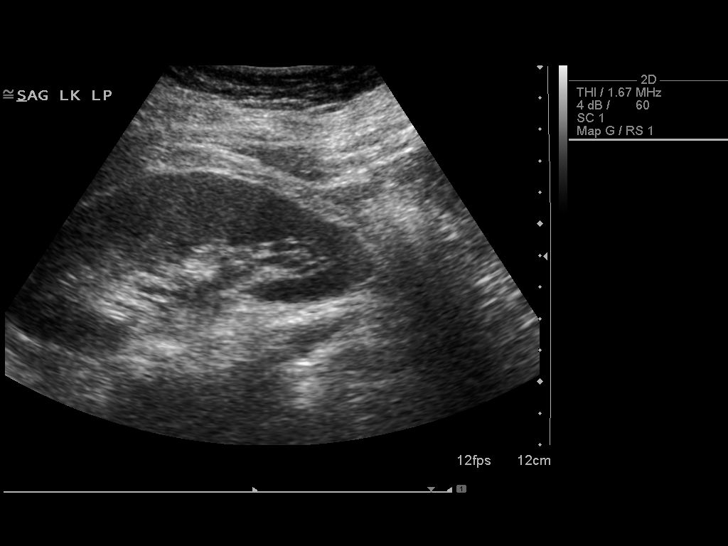
[im 70/77]
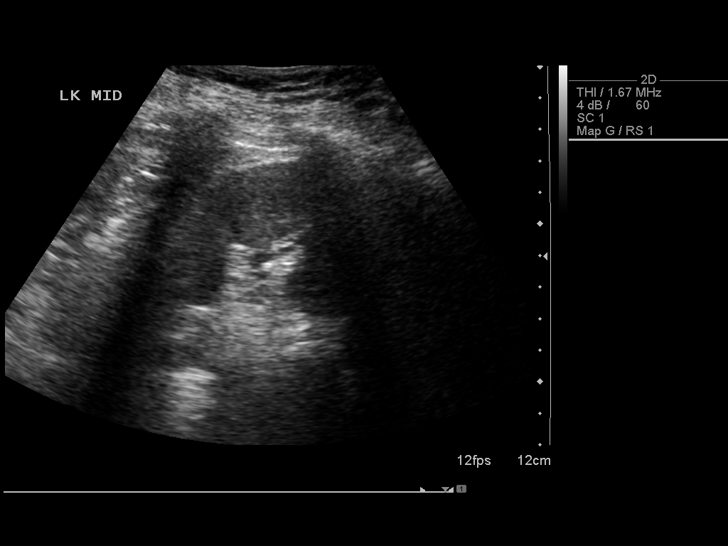
[im 77/77]
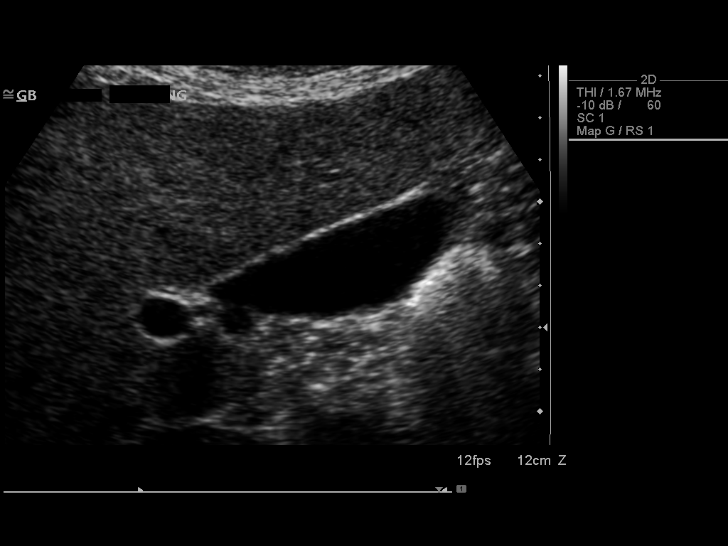

[14 of 25 positions shown; findings below may reference images not displayed]

FINDINGS: Gallbladder: No gallstones or wall thickening visualized. No
sonographic Murphy sign noted by sonographer.

Common bile duct: Diameter: 6.5 mm.

Liver: Heterogeneous echotexture suggesting fatty infiltration
and/or hepatocellular disease. No focal abnormality.

IVC: No abnormality visualized.

Pancreas: Visualized portion unremarkable.

Spleen: Size and appearance within normal limits.

Right Kidney: Length: 10.0 cm. Echogenicity within normal limits. No
mass or hydronephrosis visualized.

Left Kidney: Length: 11.3 cm. Echogenicity within normal limits. No
mass or hydronephrosis visualized.

Abdominal aorta: No aneurysm visualized.

Other findings: None.
IMPRESSION: 1. Liver echotexture is heterogeneous suggesting fatty infiltration
and/or hepatocellular disease. No focal hepatic abnormality
identified .
2. No gallstones or biliary distention. No acute abnormality. Spleen
appears normal. No focal abnormality noted to account for left upper
quadrant pain.

## 2016-07-23 ENCOUNTER — Other Ambulatory Visit (HOSPITAL_COMMUNITY): Payer: Self-pay | Admitting: Physician Assistant

## 2016-07-23 ENCOUNTER — Other Ambulatory Visit: Payer: Self-pay | Admitting: Physician Assistant

## 2016-07-23 DIAGNOSIS — R110 Nausea: Secondary | ICD-10-CM

## 2016-07-23 DIAGNOSIS — R1013 Epigastric pain: Secondary | ICD-10-CM

## 2016-07-23 DIAGNOSIS — R1012 Left upper quadrant pain: Secondary | ICD-10-CM

## 2016-07-30 ENCOUNTER — Ambulatory Visit (HOSPITAL_COMMUNITY)
Admission: RE | Admit: 2016-07-30 | Discharge: 2016-07-30 | Disposition: A | Discharge status: Home / Self Care | Payer: Managed Care, Other (non HMO) | Source: Ambulatory Visit | Attending: Physician Assistant | Admitting: Physician Assistant

## 2016-07-30 DIAGNOSIS — R1013 Epigastric pain: Secondary | ICD-10-CM | POA: Diagnosis present

## 2016-07-30 DIAGNOSIS — R110 Nausea: Secondary | ICD-10-CM | POA: Diagnosis present

## 2016-07-30 DIAGNOSIS — R1012 Left upper quadrant pain: Secondary | ICD-10-CM | POA: Insufficient documentation

## 2016-07-30 DIAGNOSIS — E119 Type 2 diabetes mellitus without complications: Secondary | ICD-10-CM | POA: Diagnosis not present

## 2016-07-30 MED ORDER — TECHNETIUM TC 99M MEBROFENIN IV KIT
5.0000 | PACK | Freq: Once | INTRAVENOUS | Status: AC | PRN
  Administered 2016-07-30: 5 via INTRAVENOUS

## 2016-10-20 ENCOUNTER — Encounter: Payer: Self-pay | Admitting: Gynecology

## 2016-10-28 ENCOUNTER — Ambulatory Visit (INDEPENDENT_AMBULATORY_CARE_PROVIDER_SITE_OTHER): Payer: Managed Care, Other (non HMO) | Admitting: Gynecology

## 2016-10-28 ENCOUNTER — Encounter: Payer: Self-pay | Admitting: Gynecology

## 2016-10-28 VITALS — BP 122/80 | Ht 61.0 in | Wt 173.0 lb

## 2016-10-28 DIAGNOSIS — E559 Vitamin D deficiency, unspecified: Secondary | ICD-10-CM | POA: Diagnosis not present

## 2016-10-28 DIAGNOSIS — Z01419 Encounter for gynecological examination (general) (routine) without abnormal findings: Secondary | ICD-10-CM

## 2016-10-28 NOTE — Progress Notes (Signed)
Janet Kelley 16-Oct-1960 [id number]   History:    56 y.o.  for annual gyn exam with no complaints today. Review of patient's record indicated that she has a prior history of laparoscopic-assisted vaginal hysterectomy secondary to endometriosis and fibroid uterus.Patient stated she was treated for cervical dysplasia via cryotherapy over 20 years ago no Pap smear is been normal since then. Patient is currently on Estrace 1 mg tablet daily and it has helped her vasomotor symptoms which was started in 2016. Her PCP has been monitoring and treating her for type 2 diabetes. Patient had a colonoscopy in 2012 benign polyp was removed and had a colonoscopy in 2017 she's on a 5 year recall. Patient had a normal bone density study in 2016.  Past medical history,surgical history, family history and social history were all reviewed and documented in the EPIC chart.  Gynecologic History No LMP recorded. Patient has had a hysterectomy. Contraception: status post hysterectomy Last Pap: 2012. Results were: normal Last mammogram: 2017. Results were: normal  Obstetric History OB History  Gravida Para Term Preterm AB Living  0 0 0 0 0 0  SAB TAB Ectopic Multiple Live Births  0 0 0 0 0         ROS: A ROS was performed and pertinent positives and negatives are included in the history.  GENERAL: No fevers or chills. HEENT: No change in vision, no earache, sore throat or sinus congestion. NECK: No pain or stiffness. CARDIOVASCULAR: No chest pain or pressure. No palpitations. PULMONARY: No shortness of breath, cough or wheeze. GASTROINTESTINAL: No abdominal pain, nausea, vomiting or diarrhea, melena or bright red blood per rectum. GENITOURINARY: No urinary frequency, urgency, hesitancy or dysuria. MUSCULOSKELETAL: No joint or muscle pain, no back pain, no recent trauma. DERMATOLOGIC: No rash, no itching, no lesions. ENDOCRINE: No polyuria, polydipsia, no heat or cold intolerance. No recent change in weight.  HEMATOLOGICAL: No anemia or easy bruising or bleeding. NEUROLOGIC: No headache, seizures, numbness, tingling or weakness. PSYCHIATRIC: No depression, no loss of interest in normal activity or change in sleep pattern.     Exam: chaperone present  BP 122/80   Ht 5\' 1"  (1.549 m)   Wt 173 lb (78.5 kg)   BMI 32.69 kg/m   Body mass index is 32.69 kg/m.  General appearance : Well developed well nourished female. No acute distress HEENT: Eyes: no retinal hemorrhage or exudates,  Neck supple, trachea midline, no carotid bruits, no thyroidmegaly Lungs: Clear to auscultation, no rhonchi or wheezes, or rib retractions  Heart: Regular rate and rhythm, no murmurs or gallops Breast:Examined in sitting and supine position were symmetrical in appearance, no palpable masses or tenderness,  no skin retraction, no nipple inversion, no nipple discharge, no skin discoloration, no axillary or supraclavicular lymphadenopathy Abdomen: no palpable masses or tenderness, no rebound or guarding Extremities: no edema or skin discoloration or tenderness  Pelvic:  Bartholin, Urethra, Skene Glands: Within normal limits             Vagina: No gross lesions or discharge  Cervix: Absent  Uterus  absent  Adnexa  Without masses or tenderness  Anus and perineum  normal   Rectovaginal  normal sphincter tone without palpated masses or tenderness             Hemoccult colonoscopy less than 12 months ago     Assessment/Plan:  56 y.o. female for annual exam overdue for mammogram which she was scheduled the next few weeks. She will  also need her bone density study schedule here in the office the next few weeks. Because of her history of vitamin D deficiency in the past along with being menopausal we are going to check a vitamin D level today. Her PCP is been doing her blood work. Pap smear no longer needed according to the guidelines. We discussed importance of calcium vitamin D and weightbearing exercises for osteoporosis  prevention.   Terrance Mass MD, 8:44 AM 10/28/2016

## 2016-10-28 NOTE — Patient Instructions (Signed)
Bone Densitometry Bone densitometry is an imaging test that uses a special X-ray to measure the amount of calcium and other minerals in your bones (bone density). This test is also known as a bone mineral density test or dual-energy X-ray absorptiometry (DXA). The test can measure bone density at your hip and your spine. It is similar to having a regular X-ray. You may have this test to:  Diagnose a condition that causes weak or thin bones (osteoporosis).  Predict your risk of a broken bone (fracture).  Determine how well osteoporosis treatment is working. Tell a health care provider about:  Any allergies you have.  All medicines you are taking, including vitamins, herbs, eye drops, creams, and over-the-counter medicines.  Any problems you or family members have had with anesthetic medicines.  Any blood disorders you have.  Any surgeries you have had.  Any medical conditions you have.  Possibility of pregnancy.  Any other medical test you had within the previous 14 days that used contrast material. What are the risks? Generally, this is a safe procedure. However, problems can occur and may include the following:  This test exposes you to a very small amount of radiation.  The risks of radiation exposure may be greater to unborn children. What happens before the procedure?  Do not take any calcium supplements for 24 hours before having the test. You can otherwise eat and drink what you usually do.  Take off all metal jewelry, eyeglasses, dental appliances, and any other metal objects. What happens during the procedure?  You may lie on an exam table. There will be an X-ray generator below you and an imaging device above you.  Other devices, such as boxes or braces, may be used to position your body properly for the scan.  You will need to lie still while the machine slowly scans your body.  The images will show up on a computer monitor. What happens after the  procedure? You may need more testing at a later time. This information is not intended to replace advice given to you by your health care provider. Make sure you discuss any questions you have with your health care provider. Document Released: 06/15/2004 Document Revised: 10/30/2015 Document Reviewed: 11/01/2013 Elsevier Interactive Patient Education  2017 Elsevier Inc.  

## 2016-10-29 ENCOUNTER — Encounter: Payer: Managed Care, Other (non HMO) | Admitting: Gynecology

## 2016-10-29 LAB — VITAMIN D 25 HYDROXY (VIT D DEFICIENCY, FRACTURES): Vit D, 25-Hydroxy: 41 ng/mL (ref 30–100)

## 2016-11-19 DIAGNOSIS — N179 Acute kidney failure, unspecified: Secondary | ICD-10-CM | POA: Insufficient documentation

## 2016-11-19 DIAGNOSIS — I959 Hypotension, unspecified: Secondary | ICD-10-CM | POA: Insufficient documentation

## 2016-11-23 ENCOUNTER — Other Ambulatory Visit: Payer: Self-pay | Admitting: Gynecology

## 2016-11-23 ENCOUNTER — Ambulatory Visit (INDEPENDENT_AMBULATORY_CARE_PROVIDER_SITE_OTHER): Payer: Managed Care, Other (non HMO)

## 2016-11-23 DIAGNOSIS — Z13820 Encounter for screening for osteoporosis: Secondary | ICD-10-CM | POA: Diagnosis not present

## 2016-11-23 DIAGNOSIS — E559 Vitamin D deficiency, unspecified: Secondary | ICD-10-CM

## 2016-11-26 ENCOUNTER — Encounter: Payer: Self-pay | Admitting: Gynecology

## 2016-11-29 ENCOUNTER — Other Ambulatory Visit: Payer: Self-pay | Admitting: Physician Assistant

## 2016-11-29 DIAGNOSIS — R079 Chest pain, unspecified: Secondary | ICD-10-CM

## 2016-12-09 ENCOUNTER — Telehealth (HOSPITAL_COMMUNITY): Payer: Self-pay | Admitting: *Deleted

## 2016-12-09 NOTE — Telephone Encounter (Signed)
Left message on voicemail in reference to upcoming appointment scheduled for 12/14/16. Phone number given for a call back so details instructions can be given. Jolita Haefner, Ranae Palms

## 2016-12-09 NOTE — Telephone Encounter (Signed)
Patient given detailed instructions per Myocardial Perfusion Study Information Sheet for the test on 12/14/16 at 0745. Patient notified to arrive 15 minutes early and that it is imperative to arrive on time for appointment to keep from having the test rescheduled.  If you need to cancel or reschedule your appointment, please call the office within 24 hours of your appointment. . Patient verbalized understanding.Nomie Buchberger, Ranae Palms

## 2016-12-14 ENCOUNTER — Ambulatory Visit (HOSPITAL_COMMUNITY): Payer: Managed Care, Other (non HMO) | Attending: Internal Medicine

## 2016-12-14 ENCOUNTER — Encounter (INDEPENDENT_AMBULATORY_CARE_PROVIDER_SITE_OTHER): Payer: Self-pay

## 2016-12-14 DIAGNOSIS — R079 Chest pain, unspecified: Secondary | ICD-10-CM | POA: Insufficient documentation

## 2016-12-14 MED ORDER — TECHNETIUM TC 99M TETROFOSMIN IV KIT
32.8000 | PACK | Freq: Once | INTRAVENOUS | Status: AC | PRN
  Administered 2016-12-14: 32.8 via INTRAVENOUS
  Filled 2016-12-14: qty 33

## 2016-12-14 MED ORDER — REGADENOSON 0.4 MG/5ML IV SOLN
0.4000 mg | Freq: Once | INTRAVENOUS | Status: AC
  Administered 2016-12-14: 0.4 mg via INTRAVENOUS

## 2016-12-17 ENCOUNTER — Ambulatory Visit (HOSPITAL_COMMUNITY): Payer: Managed Care, Other (non HMO) | Attending: Cardiovascular Disease

## 2016-12-17 ENCOUNTER — Encounter (INDEPENDENT_AMBULATORY_CARE_PROVIDER_SITE_OTHER): Payer: Self-pay

## 2016-12-17 MED ORDER — TECHNETIUM TC 99M TETROFOSMIN IV KIT
32.0000 | PACK | Freq: Once | INTRAVENOUS | Status: AC | PRN
Start: 2016-12-17 — End: 2016-12-17
  Administered 2016-12-17: 32 via INTRAVENOUS
  Filled 2016-12-17: qty 32

## 2016-12-20 LAB — MYOCARDIAL PERFUSION IMAGING
CHL CUP NUCLEAR SDS: 0
LV dias vol: 80 mL (ref 46–106)
LVSYSVOL: 29 mL
Peak HR: 116 {beats}/min
RATE: 0.28
Rest HR: 83 {beats}/min
SRS: 1
SSS: 1
TID: 0.9

## 2017-01-08 ENCOUNTER — Other Ambulatory Visit: Payer: Self-pay | Admitting: Gynecology

## 2017-03-01 ENCOUNTER — Other Ambulatory Visit: Payer: Self-pay | Admitting: Family Medicine

## 2017-03-01 DIAGNOSIS — R9389 Abnormal findings on diagnostic imaging of other specified body structures: Secondary | ICD-10-CM

## 2017-03-09 ENCOUNTER — Ambulatory Visit
Admission: RE | Admit: 2017-03-09 | Discharge: 2017-03-09 | Disposition: A | Discharge status: Home / Self Care | Payer: Managed Care, Other (non HMO) | Source: Ambulatory Visit | Attending: Family Medicine | Admitting: Family Medicine

## 2017-03-09 DIAGNOSIS — R9389 Abnormal findings on diagnostic imaging of other specified body structures: Secondary | ICD-10-CM

## 2017-03-09 MED ORDER — IOPAMIDOL (ISOVUE-300) INJECTION 61%
75.0000 mL | Freq: Once | INTRAVENOUS | Status: AC | PRN
  Administered 2017-03-09: 75 mL via INTRAVENOUS

## 2017-11-01 ENCOUNTER — Encounter: Payer: Self-pay | Admitting: Obstetrics & Gynecology

## 2017-11-01 ENCOUNTER — Ambulatory Visit (INDEPENDENT_AMBULATORY_CARE_PROVIDER_SITE_OTHER): Payer: Managed Care, Other (non HMO) | Admitting: Obstetrics & Gynecology

## 2017-11-01 VITALS — BP 122/70 | Ht 61.0 in | Wt 174.0 lb

## 2017-11-01 DIAGNOSIS — Z90710 Acquired absence of both cervix and uterus: Secondary | ICD-10-CM

## 2017-11-01 DIAGNOSIS — Z1151 Encounter for screening for human papillomavirus (HPV): Secondary | ICD-10-CM

## 2017-11-01 DIAGNOSIS — Z79890 Hormone replacement therapy (postmenopausal): Secondary | ICD-10-CM | POA: Diagnosis not present

## 2017-11-01 DIAGNOSIS — Z01419 Encounter for gynecological examination (general) (routine) without abnormal findings: Secondary | ICD-10-CM | POA: Diagnosis not present

## 2017-11-01 DIAGNOSIS — N952 Postmenopausal atrophic vaginitis: Secondary | ICD-10-CM | POA: Diagnosis not present

## 2017-11-01 MED ORDER — ESTRADIOL 10 MCG VA TABS: 1.0000 | ORAL_TABLET | VAGINAL | 4 refills | Status: DC

## 2017-11-01 MED ORDER — ESTRADIOL 0.1 MG/24HR TD PTWK
0.1000 mg | MEDICATED_PATCH | TRANSDERMAL | 4 refills | Status: DC
Start: 2017-11-01 — End: 2018-11-03

## 2017-11-01 NOTE — Addendum Note (Signed)
Addended by: Thurnell Garbe A on: 11/01/2017 05:06 PM   Modules accepted: Orders

## 2017-11-01 NOTE — Patient Instructions (Signed)
1. Encounter for routine gynecological examination with Papanicolaou smear of cervix Normal gynecologic exam status post hysterectomy with atrophic vaginitis.  Pap with high-risk HPV done.  Breast exam normal.  Will schedule screening mammogram in June 2019.  Body mass index 32.88.  Recommend regular physical activity with aerobic activities 5 times a week and weightlifting every 2 days.  Lower calorie/carb diet recommended, such as [employer].  2. H/O vaginal hysterectomy  3. Post-menopause on HRT (hormone replacement therapy) Well on hormone replacement therapy with estradiol 1 mg p.o. Daily, but still some night sweats.  No contraindication to hormone replacement therapy.  Started hormones in 2016.  Status post hysterectomy.  Decision to switch to the estradiol patch 0.1 mg weekly.  Usage, risks and benefits reviewed.  Prescription sent to pharmacy.  4. Post-menopausal atrophic vaginitis Not helped by lubricants.  Will try Vagifem generic.  Instructions given to insert 1 tablet twice a week.  Prescription sent to pharmacy.  Other orders - estradiol (CLIMARA - DOSED IN MG/24 HR) 0.1 mg/24hr patch; Place 1 patch (0.1 mg total) onto the skin once a week. - Estradiol 10 MCG TABS vaginal tablet; Place 1 tablet (10 mcg total) vaginally 2 (two) times a week.  Janet Kelley, it was a pleasure meeting you today!  I will inform you of your results as soon as they are available.   Calorie Counting for Weight Loss Calories are units of energy. Your body needs a certain amount of calories from food to keep you going throughout the day. When you eat more calories than your body needs, your body stores the extra calories as fat. When you eat fewer calories than your body needs, your body burns fat to get the energy it needs. Calorie counting means keeping track of how many calories you eat and drink each day. Calorie counting can be helpful if you need to lose weight. If you make sure to eat fewer calories  than your body needs, you should lose weight. Ask your health care provider what a healthy weight is for you. For calorie counting to work, you will need to eat the right number of calories in a day in order to lose a healthy amount of weight per week. A dietitian can help you determine how many calories you need in a day and will give you suggestions on how to reach your calorie goal.  A healthy amount of weight to lose per week is usually 1-2 lb (0.5-0.9 kg). This usually means that your daily calorie intake should be reduced by 500-750 calories.  Eating 1,200 - 1,500 calories per day can help most women lose weight.  Eating 1,500 - 1,800 calories per day can help most men lose weight.  What is my plan? My goal is to have __________ calories per day. If I have this many calories per day, I should lose around __________ pounds per week. What do I need to know about calorie counting? In order to meet your daily calorie goal, you will need to:  Find out how many calories are in each food you would like to eat. Try to do this before you eat.  Decide how much of the food you plan to eat.  Write down what you ate and how many calories it had. Doing this is called keeping a food log.  To successfully lose weight, it is important to balance calorie counting with a healthy lifestyle that includes regular activity. Aim for 150 minutes of moderate exercise (such as  walking) or 75 minutes of vigorous exercise (such as running) each week. Where do I find calorie information?  The number of calories in a food can be found on a Nutrition Facts label. If a food does not have a Nutrition Facts label, try to look up the calories online or ask your dietitian for help. Remember that calories are listed per serving. If you choose to have more than one serving of a food, you will have to multiply the calories per serving by the amount of servings you plan to eat. For example, the label on a package of bread  might say that a serving size is 1 slice and that there are 90 calories in a serving. If you eat 1 slice, you will have eaten 90 calories. If you eat 2 slices, you will have eaten 180 calories. How do I keep a food log? Immediately after each meal, record the following information in your food log:  What you ate. Don't forget to include toppings, sauces, and other extras on the food.  How much you ate. This can be measured in cups, ounces, or number of items.  How many calories each food and drink had.  The total number of calories in the meal.  Keep your food log near you, such as in a small notebook in your pocket, or use a mobile app or website. Some programs will calculate calories for you and show you how many calories you have left for the day to meet your goal. What are some calorie counting tips?  Use your calories on foods and drinks that will fill you up and not leave you hungry: ? Some examples of foods that fill you up are nuts and nut butters, vegetables, lean proteins, and high-fiber foods like whole grains. High-fiber foods are foods with more than 5 g fiber per serving. ? Drinks such as sodas, specialty coffee drinks, alcohol, and juices have a lot of calories, yet do not fill you up.  Eat nutritious foods and avoid empty calories. Empty calories are calories you get from foods or beverages that do not have many vitamins or protein, such as candy, sweets, and soda. It is better to have a nutritious high-calorie food (such as an avocado) than a food with few nutrients (such as a bag of chips).  Know how many calories are in the foods you eat most often. This will help you calculate calorie counts faster.  Pay attention to calories in drinks. Low-calorie drinks include water and unsweetened drinks.  Pay attention to nutrition labels for "low fat" or "fat free" foods. These foods sometimes have the same amount of calories or more calories than the full fat versions. They also  often have added sugar, starch, or salt, to make up for flavor that was removed with the fat.  Find a way of tracking calories that works for you. Get creative. Try different apps or programs if writing down calories does not work for you. What are some portion control tips?  Know how many calories are in a serving. This will help you know how many servings of a certain food you can have.  Use a measuring cup to measure serving sizes. You could also try weighing out portions on a kitchen scale. With time, you will be able to estimate serving sizes for some foods.  Take some time to put servings of different foods on your favorite plates, bowls, and cups so you know what a serving looks like.  Try  not to eat straight from a bag or box. Doing this can lead to overeating. Put the amount you would like to eat in a cup or on a plate to make sure you are eating the right portion.  Use smaller plates, glasses, and bowls to prevent overeating.  Try not to multitask (for example, watch TV or use your computer) while eating. If it is time to eat, sit down at a table and enjoy your food. This will help you to know when you are full. It will also help you to be aware of what you are eating and how much you are eating. What are tips for following this plan? Reading food labels  Check the calorie count compared to the serving size. The serving size may be smaller than what you are used to eating.  Check the source of the calories. Make sure the food you are eating is high in vitamins and protein and low in saturated and trans fats. Shopping  Read nutrition labels while you shop. This will help you make healthy decisions before you decide to purchase your food.  Make a grocery list and stick to it. Cooking  Try to cook your favorite foods in a healthier way. For example, try baking instead of frying.  Use low-fat dairy products. Meal planning  Use more fruits and vegetables. Half of your plate  should be fruits and vegetables.  Include lean proteins like poultry and fish. How do I count calories when eating out?  Ask for smaller portion sizes.  Consider sharing an entree and sides instead of getting your own entree.  If you get your own entree, eat only half. Ask for a box at the beginning of your meal and put the rest of your entree in it so you are not tempted to eat it.  If calories are listed on the menu, choose the lower calorie options.  Choose dishes that include vegetables, fruits, whole grains, low-fat dairy products, and lean protein.  Choose items that are boiled, broiled, grilled, or steamed. Stay away from items that are buttered, battered, fried, or served with cream sauce. Items labeled "crispy" are usually fried, unless stated otherwise.  Choose water, low-fat milk, unsweetened iced tea, or other drinks without added sugar. If you want an alcoholic beverage, choose a lower calorie option such as a glass of wine or light beer.  Ask for dressings, sauces, and syrups on the side. These are usually high in calories, so you should limit the amount you eat.  If you want a salad, choose a garden salad and ask for grilled meats. Avoid extra toppings like bacon, cheese, or fried items. Ask for the dressing on the side, or ask for olive oil and vinegar or lemon to use as dressing.  Estimate how many servings of a food you are given. For example, a serving of cooked rice is  cup or about the size of half a baseball. Knowing serving sizes will help you be aware of how much food you are eating at restaurants. The list below tells you how big or small some common portion sizes are based on everyday objects: ? 1 oz-4 stacked dice. ? 3 oz-1 deck of cards. ? 1 tsp-1 die. ? 1 Tbsp- a ping-pong ball. ? 2 Tbsp-1 ping-pong ball. ?  cup- baseball. ? 1 cup-1 baseball. Summary  Calorie counting means keeping track of how many calories you eat and drink each day. If you eat  fewer calories than your body  needs, you should lose weight.  A healthy amount of weight to lose per week is usually 1-2 lb (0.5-0.9 kg). This usually means reducing your daily calorie intake by 500-750 calories.  The number of calories in a food can be found on a Nutrition Facts label. If a food does not have a Nutrition Facts label, try to look up the calories online or ask your dietitian for help.  Use your calories on foods and drinks that will fill you up, and not on foods and drinks that will leave you hungry.  Use smaller plates, glasses, and bowls to prevent overeating. This information is not intended to replace advice given to you by your health care provider. Make sure you discuss any questions you have with your health care provider. Document Released: 05/24/2005 Document Revised: 04/23/2016 Document Reviewed: 04/23/2016 Elsevier Interactive Patient Education  [employer].

## 2017-11-01 NOTE — Progress Notes (Signed)
Janet Kelley [birth year] [id number]   History:    57 y.o. G0 Single.  Female friend  RP:  Established patient presenting for annual gyn exam   HPI: S/P Vaginal Hysterectomy.  Menopause with night sweats.  On Estradiol 1 mg tab daily with improvement of vasomotor Sxs.  Started HRT in 2016.  No pelvic pain.  Occasionally sexually active with female friend ([place]).  Using condoms.  C/O dryness with IC.  Lubricant not enough.  Urine/BMs wnl.  Breasts wnl.  BMI 32.88.  Mildly physically active.  Past medical history,surgical history, family history and social history were all reviewed and documented in the EPIC chart.  Gynecologic History No LMP recorded. Patient has had a hysterectomy. Contraception: status post hysterectomy Last Pap: 12/2010. Results were: Negative Last mammogram: 11/2016. Results were: Benign Bone Density: 11/2016 Normal Colonoscopy: 2018  Obstetric History OB History  Gravida Para Term Preterm AB Living  0 0 0 0 0 0  SAB TAB Ectopic Multiple Live Births  0 0 0 0 0     ROS: A ROS was performed and pertinent positives and negatives are included in the history.  GENERAL: No fevers or chills. HEENT: No change in vision, no earache, sore throat or sinus congestion. NECK: No pain or stiffness. CARDIOVASCULAR: No chest pain or pressure. No palpitations. PULMONARY: No shortness of breath, cough or wheeze. GASTROINTESTINAL: No abdominal pain, nausea, vomiting or diarrhea, melena or bright red blood per rectum. GENITOURINARY: No urinary frequency, urgency, hesitancy or dysuria. MUSCULOSKELETAL: No joint or muscle pain, no back pain, no recent trauma. DERMATOLOGIC: No rash, no itching, no lesions. ENDOCRINE: No polyuria, polydipsia, no heat or cold intolerance. No recent change in weight. HEMATOLOGICAL: No anemia or easy bruising or bleeding. NEUROLOGIC: No headache, seizures, numbness, tingling or weakness. PSYCHIATRIC: No depression, no loss of interest in normal  activity or change in sleep pattern.     Exam:   BP 122/70   Ht 5\' 1"  (1.549 m)   Wt 174 lb (78.9 kg)   BMI 32.88 kg/m   Body mass index is 32.88 kg/m.  General appearance : Well developed well nourished female. No acute distress HEENT: Eyes: no retinal hemorrhage or exudates,  Neck supple, trachea midline, no carotid bruits, no thyroidmegaly Lungs: Clear to auscultation, no rhonchi or wheezes, or rib retractions  Heart: Regular rate and rhythm, no murmurs or gallops Breast:Examined in sitting and supine position were symmetrical in appearance, no palpable masses or tenderness,  no skin retraction, no nipple inversion, no nipple discharge, no skin discoloration, no axillary or supraclavicular lymphadenopathy Abdomen: no palpable masses or tenderness, no rebound or guarding Extremities: no edema or skin discoloration or tenderness  Pelvic: Vulva: Normal             Vagina: No gross lesions or discharge.  Pap/HPV HR done.  Cervix/Uterus absent  Adnexa  Without masses or tenderness  Anus: Normal   Assessment/Plan:  57 y.o. female for annual exam   1. Encounter for routine gynecological examination with Papanicolaou smear of cervix Normal gynecologic exam status post hysterectomy with atrophic vaginitis.  Pap with high-risk HPV done.  Breast exam normal.  Will schedule screening mammogram in June 2019.  Body mass index 32.88.  Recommend regular physical activity with aerobic activities 5 times a week and weightlifting every 2 days.  Lower calorie/carb diet recommended, such as [employer].  2. H/O vaginal hysterectomy  3. Post-menopause on HRT (hormone replacement therapy) Well  on hormone replacement therapy with estradiol 1 mg p.o. Daily, but still some night sweats.  No contraindication to hormone replacement therapy.  Started hormones in 2016.  Status post hysterectomy.  Decision to switch to the estradiol patch 0.1 mg weekly.  Usage, risks and benefits reviewed.   Prescription sent to pharmacy.  4. Post-menopausal atrophic vaginitis Not helped by lubricants.  Will try Vagifem generic.  Instructions given to insert 1 tablet twice a week.  Prescription sent to pharmacy.  Other orders - estradiol (CLIMARA - DOSED IN MG/24 HR) 0.1 mg/24hr patch; Place 1 patch (0.1 mg total) onto the skin once a week. - Estradiol 10 MCG TABS vaginal tablet; Place 1 tablet (10 mcg total) vaginally 2 (two) times a week.   Princess Bruins MD, 4:20 PM 11/01/2017

## 2017-11-02 LAB — PAP, TP IMAGING W/ HPV RNA, RFLX HPV TYPE 16,18/45: HPV DNA High Risk: NOT DETECTED

## 2018-07-06 ENCOUNTER — Other Ambulatory Visit: Payer: Self-pay | Admitting: Physician Assistant

## 2018-07-06 ENCOUNTER — Ambulatory Visit
Admission: RE | Admit: 2018-07-06 | Discharge: 2018-07-06 | Disposition: A | Discharge status: Home / Self Care | Payer: Managed Care, Other (non HMO) | Source: Ambulatory Visit | Attending: Physician Assistant | Admitting: Physician Assistant

## 2018-07-06 DIAGNOSIS — M25511 Pain in right shoulder: Secondary | ICD-10-CM

## 2018-11-02 ENCOUNTER — Other Ambulatory Visit: Payer: Self-pay

## 2018-11-03 ENCOUNTER — Encounter: Payer: Self-pay | Admitting: Obstetrics & Gynecology

## 2018-11-03 ENCOUNTER — Ambulatory Visit (INDEPENDENT_AMBULATORY_CARE_PROVIDER_SITE_OTHER): Payer: Managed Care, Other (non HMO) | Admitting: Obstetrics & Gynecology

## 2018-11-03 VITALS — BP 130/70 | Ht 61.0 in | Wt 158.0 lb

## 2018-11-03 DIAGNOSIS — Z90710 Acquired absence of both cervix and uterus: Secondary | ICD-10-CM | POA: Diagnosis not present

## 2018-11-03 DIAGNOSIS — Z780 Asymptomatic menopausal state: Secondary | ICD-10-CM | POA: Diagnosis not present

## 2018-11-03 DIAGNOSIS — Z113 Encounter for screening for infections with a predominantly sexual mode of transmission: Secondary | ICD-10-CM

## 2018-11-03 DIAGNOSIS — Z01419 Encounter for gynecological examination (general) (routine) without abnormal findings: Secondary | ICD-10-CM

## 2018-11-03 DIAGNOSIS — N952 Postmenopausal atrophic vaginitis: Secondary | ICD-10-CM

## 2018-11-03 DIAGNOSIS — R102 Pelvic and perineal pain: Secondary | ICD-10-CM

## 2018-11-03 MED ORDER — ESTRADIOL 10 MCG VA TABS: 1.0000 | ORAL_TABLET | VAGINAL | 4 refills | Status: DC

## 2018-11-03 NOTE — Progress Notes (Signed)
Janet Kelley 1961/05/25 [id number]   History:    58 y.o. G0 Single.  New boyfriend, not sexually active yet.  RP:  Established patient presenting for annual gyn exam   HPI: S/P Vaginal Hysterectomy.  Menopause, well on no HRT.  No pelvic pain.  Not currently sexually active, but has a new boyfriend, will probably resume sexual activity soon, would like to continue on Estradiol tab vaginally twice a week.  Mild suprapubic and lower back discomfort, no burning with miction, no frequency, no fever.  BMs normal.  Breasts normal.  BMI 29.85.  Lost 16 Lbs x last year.  Walks a lot at work.  DM type 2 on Janumet.  Health labs with Fam MD.  Past medical history,surgical history, family history and social history were all reviewed and documented in the EPIC chart.  Gynecologic History No LMP recorded. Patient has had a hysterectomy. Contraception: status post hysterectomy Last Pap: 10/2017. Results were: Negative, HPV HR neg Last mammogram: 05/2018. Results were: Negative Bone Density: 11/2016 Normal, repeat at 6 yrs Colonoscopy: 2018  Obstetric History OB History  Gravida Para Term Preterm AB Living  0 0 0 0 0 0  SAB TAB Ectopic Multiple Live Births  0 0 0 0 0     ROS: A ROS was performed and pertinent positives and negatives are included in the history.  GENERAL: No fevers or chills. HEENT: No change in vision, no earache, sore throat or sinus congestion. NECK: No pain or stiffness. CARDIOVASCULAR: No chest pain or pressure. No palpitations. PULMONARY: No shortness of breath, cough or wheeze. GASTROINTESTINAL: No abdominal pain, nausea, vomiting or diarrhea, melena or bright red blood per rectum. GENITOURINARY: No urinary frequency, urgency, hesitancy or dysuria. MUSCULOSKELETAL: No joint or muscle pain, no back pain, no recent trauma. DERMATOLOGIC: No rash, no itching, no lesions. ENDOCRINE: No polyuria, polydipsia, no heat or cold intolerance. No recent change in weight. HEMATOLOGICAL:  No anemia or easy bruising or bleeding. NEUROLOGIC: No headache, seizures, numbness, tingling or weakness. PSYCHIATRIC: No depression, no loss of interest in normal activity or change in sleep pattern.     Exam:   BP 130/70   Ht 5\' 1"  (1.549 m)   Wt 158 lb (71.7 kg)   BMI 29.85 kg/m   Body mass index is 29.85 kg/m.  General appearance : Well developed well nourished female. No acute distress HEENT: Eyes: no retinal hemorrhage or exudates,  Neck supple, trachea midline, no carotid bruits, no thyroidmegaly Lungs: Clear to auscultation, no rhonchi or wheezes, or rib retractions  Heart: Regular rate and rhythm, no murmurs or gallops Breast:Examined in sitting and supine position were symmetrical in appearance, no palpable masses or tenderness,  no skin retraction, no nipple inversion, no nipple discharge, no skin discoloration, no axillary or supraclavicular lymphadenopathy Abdomen: no palpable masses or tenderness, no rebound or guarding Extremities: no edema or skin discoloration or tenderness  Pelvic: Vulva: Normal             Vagina: No gross lesions or discharge  Cervix/Uterus absent  Adnexa  Without masses or tenderness  Anus: Normal  Urine analysis: Completely negative   Assessment/Plan:  58 y.o. female for annual exam   1. Well female exam with routine gynecological exam Gynecologic exam status post vaginal hysterectomy and menopause.  Pap test in May 2019 was negative with negative high-risk HPV, no indication to repeat this year.  Breast exam normal.  Last screening mammogram December 2019 was negative.  Colonoscopy  in 2018.  Health labs with family physician.  Diabetes mellitus type 2 on Janumet followed by family physician.  2. S/P total hysterectomy  3. Postmenopause Well on no hormone replacement therapy.  Last bone density in June 2018 was normal, will repeat at 5 years.  Recommend vitamin D supplements, calcium intake of 1200 to 1500 mg daily and regular  weightbearing physical activities.  4. Post-menopausal atrophic vaginitis Well on estradiol tablets vaginally twice a week.  No contraindication to continue.  Prescription sent to pharmacy.  5. Screen for STD (sexually transmitted disease) Strict condom use recommended. - HIV antibody (with reflex) - RPR - Hepatitis C Antibody - Hepatitis B Surface AntiGEN - C. trachomatis/N. gonorrhoeae RNA  6. Suprapubic pain Urine analysis completely negative.  Patient reassured. - Urinalysis,Complete w/RFL Culture  Other orders - Estradiol 10 MCG TABS vaginal tablet; Place 1 tablet (10 mcg total) vaginally 2 (two) times a week.  Princess Bruins MD, 2:46 PM 11/03/2018

## 2018-11-03 NOTE — Patient Instructions (Signed)
1. Well female exam with routine gynecological exam Gynecologic exam status post vaginal hysterectomy and menopause.  Pap test in May 2019 was negative with negative high-risk HPV, no indication to repeat this year.  Breast exam normal.  Last screening mammogram December 2019 was negative.  Colonoscopy in 2018.  Health labs with family physician.  Diabetes mellitus type 2 on Janumet followed by family physician.  2. S/P total hysterectomy  3. Postmenopause Well on no hormone replacement therapy.  Last bone density in June 2018 was normal, will repeat at 5 years.  Recommend vitamin D supplements, calcium intake of 1200 to 1500 mg daily and regular weightbearing physical activities.  4. Post-menopausal atrophic vaginitis Well on estradiol tablets vaginally twice a week.  No contraindication to continue.  Prescription sent to pharmacy.  5. Screen for STD (sexually transmitted disease) Strict condom use recommended. - HIV antibody (with reflex) - RPR - Hepatitis C Antibody - Hepatitis B Surface AntiGEN - C. trachomatis/N. gonorrhoeae RNA  6. Suprapubic pain Urine analysis completely negative.  Patient reassured. - Urinalysis,Complete w/RFL Culture  Other orders - Estradiol 10 MCG TABS vaginal tablet; Place 1 tablet (10 mcg total) vaginally 2 (two) times a week.  Ronni, it was a pleasure seeing you today!  I will inform you of your results as soon as they are available.

## 2018-11-04 LAB — URINALYSIS, COMPLETE W/RFL CULTURE
Bacteria, UA: NONE SEEN /HPF
Bilirubin Urine: NEGATIVE
Hgb urine dipstick: NEGATIVE
Hyaline Cast: NONE SEEN /LPF
Ketones, ur: NEGATIVE
Leukocyte Esterase: NEGATIVE
Nitrites, Initial: NEGATIVE
Protein, ur: NEGATIVE
RBC / HPF: NONE SEEN /HPF (ref 0–2)
Specific Gravity, Urine: 1.006 (ref 1.001–1.03)
WBC, UA: NONE SEEN /HPF (ref 0–5)
pH: 5.5 (ref 5.0–8.0)

## 2018-11-04 LAB — C. TRACHOMATIS/N. GONORRHOEAE RNA
C. trachomatis RNA, TMA: NOT DETECTED
N. gonorrhoeae RNA, TMA: NOT DETECTED

## 2018-11-04 LAB — NO CULTURE INDICATED

## 2018-11-06 LAB — HIV ANTIBODY (ROUTINE TESTING W REFLEX): HIV 1&2 Ab, 4th Generation: NONREACTIVE

## 2018-11-06 LAB — HEPATITIS C ANTIBODY
Hepatitis C Ab: NONREACTIVE
SIGNAL TO CUT-OFF: 0.01 (ref <1.00)

## 2018-11-06 LAB — RPR: RPR Ser Ql: NONREACTIVE

## 2018-11-06 LAB — HEPATITIS B SURFACE ANTIGEN: Hepatitis B Surface Ag: NONREACTIVE

## 2019-04-13 ENCOUNTER — Other Ambulatory Visit: Payer: Self-pay

## 2019-04-13 DIAGNOSIS — Z20822 Contact with and (suspected) exposure to covid-19: Secondary | ICD-10-CM

## 2019-04-14 LAB — NOVEL CORONAVIRUS, NAA: SARS-CoV-2, NAA: NOT DETECTED

## 2019-04-25 ENCOUNTER — Other Ambulatory Visit: Payer: Self-pay | Admitting: Physician Assistant

## 2019-04-25 DIAGNOSIS — J984 Other disorders of lung: Secondary | ICD-10-CM

## 2019-05-07 ENCOUNTER — Other Ambulatory Visit: Payer: Managed Care, Other (non HMO)

## 2019-05-25 ENCOUNTER — Encounter: Payer: Self-pay | Admitting: Obstetrics & Gynecology

## 2019-06-11 ENCOUNTER — Encounter (HOSPITAL_COMMUNITY): Payer: Self-pay | Admitting: Emergency Medicine

## 2019-06-11 ENCOUNTER — Other Ambulatory Visit: Payer: Self-pay

## 2019-06-11 DIAGNOSIS — R1110 Vomiting, unspecified: Secondary | ICD-10-CM | POA: Diagnosis not present

## 2019-06-11 DIAGNOSIS — Z7982 Long term (current) use of aspirin: Secondary | ICD-10-CM | POA: Insufficient documentation

## 2019-06-11 DIAGNOSIS — Z87891 Personal history of nicotine dependence: Secondary | ICD-10-CM | POA: Insufficient documentation

## 2019-06-11 DIAGNOSIS — Z7984 Long term (current) use of oral hypoglycemic drugs: Secondary | ICD-10-CM | POA: Insufficient documentation

## 2019-06-11 DIAGNOSIS — E0590 Thyrotoxicosis, unspecified without thyrotoxic crisis or storm: Secondary | ICD-10-CM | POA: Insufficient documentation

## 2019-06-11 DIAGNOSIS — R109 Unspecified abdominal pain: Secondary | ICD-10-CM | POA: Diagnosis present

## 2019-06-11 DIAGNOSIS — Z79899 Other long term (current) drug therapy: Secondary | ICD-10-CM | POA: Insufficient documentation

## 2019-06-11 DIAGNOSIS — R0602 Shortness of breath: Secondary | ICD-10-CM | POA: Diagnosis not present

## 2019-06-11 DIAGNOSIS — E119 Type 2 diabetes mellitus without complications: Secondary | ICD-10-CM | POA: Insufficient documentation

## 2019-06-11 LAB — COMPREHENSIVE METABOLIC PANEL
ALT: 120 U/L — ABNORMAL HIGH (ref 0–44)
AST: 41 U/L (ref 15–41)
Albumin: 4.1 g/dL (ref 3.5–5.0)
Alkaline Phosphatase: 66 U/L (ref 38–126)
Anion gap: 16 — ABNORMAL HIGH (ref 5–15)
BUN: 21 mg/dL — ABNORMAL HIGH (ref 6–20)
CO2: 23 mmol/L (ref 22–32)
Calcium: 10.5 mg/dL — ABNORMAL HIGH (ref 8.9–10.3)
Chloride: 100 mmol/L (ref 98–111)
Creatinine, Ser: 0.7 mg/dL (ref 0.44–1.00)
GFR calc Af Amer: >60 mL/min (ref >60)
GFR calc non Af Amer: >60 mL/min (ref >60)
Glucose, Bld: 200 mg/dL — ABNORMAL HIGH (ref 70–99)
Potassium: 3.2 mmol/L — ABNORMAL LOW (ref 3.5–5.1)
Sodium: 139 mmol/L (ref 135–145)
Total Bilirubin: 1 mg/dL (ref 0.3–1.2)
Total Protein: 7.4 g/dL (ref 6.5–8.1)

## 2019-06-11 LAB — LIPASE, BLOOD: Lipase: 33 U/L (ref 11–51)

## 2019-06-11 LAB — CBC
HCT: 40 % (ref 36.0–46.0)
Hemoglobin: 12.1 g/dL (ref 12.0–15.0)
MCH: 27.4 pg (ref 26.0–34.0)
MCHC: 30.3 g/dL (ref 30.0–36.0)
MCV: 90.7 fL (ref 80.0–100.0)
Platelets: 274 10*3/uL (ref 150–400)
RBC: 4.41 MIL/uL (ref 3.87–5.11)
RDW: 12.6 % (ref 11.5–15.5)
WBC: 10.3 10*3/uL (ref 4.0–10.5)
nRBC: 0 % (ref 0.0–0.2)

## 2019-06-11 LAB — I-STAT BETA HCG BLOOD, ED (MC, WL, AP ONLY): I-stat hCG, quantitative: 13.3 m[IU]/mL — ABNORMAL HIGH (ref <5)

## 2019-06-11 NOTE — ED Triage Notes (Signed)
Pt c/o abd pains with n/v and SOB for week. Was Covid negative.

## 2019-06-12 ENCOUNTER — Emergency Department (HOSPITAL_COMMUNITY): Payer: Managed Care, Other (non HMO)

## 2019-06-12 ENCOUNTER — Emergency Department (HOSPITAL_COMMUNITY)
Admission: EM | Admit: 2019-06-12 | Discharge: 2019-06-12 | Disposition: A | Discharge status: Home / Self Care | Payer: Managed Care, Other (non HMO) | Attending: Emergency Medicine | Admitting: Emergency Medicine

## 2019-06-12 ENCOUNTER — Encounter (HOSPITAL_COMMUNITY): Payer: Self-pay

## 2019-06-12 DIAGNOSIS — E0590 Thyrotoxicosis, unspecified without thyrotoxic crisis or storm: Secondary | ICD-10-CM

## 2019-06-12 LAB — URINALYSIS, ROUTINE W REFLEX MICROSCOPIC
Bacteria, UA: NONE SEEN
Bilirubin Urine: NEGATIVE
Glucose, UA: >=500 mg/dL — AB
Hgb urine dipstick: NEGATIVE
Ketones, ur: 5 mg/dL — AB
Leukocytes,Ua: NEGATIVE
Nitrite: NEGATIVE
Protein, ur: NEGATIVE mg/dL
Specific Gravity, Urine: 1.025 (ref 1.005–1.030)
pH: 5 (ref 5.0–8.0)

## 2019-06-12 LAB — T4, FREE: Free T4: 4.84 ng/dL — ABNORMAL HIGH (ref 0.61–1.12)

## 2019-06-12 LAB — TROPONIN I (HIGH SENSITIVITY)
Troponin I (High Sensitivity): 5 ng/L (ref <18)
Troponin I (High Sensitivity): 6 ng/L (ref <18)

## 2019-06-12 LAB — TSH: TSH: <0.01 u[IU]/mL — ABNORMAL LOW (ref 0.350–4.500)

## 2019-06-12 MED ORDER — METHIMAZOLE 10 MG PO TABS: 10.0000 mg | ORAL_TABLET | Freq: Two times a day (BID) | ORAL | 1 refills | Status: DC

## 2019-06-12 MED ORDER — IOHEXOL 350 MG/ML SOLN
100.0000 mL | Freq: Once | INTRAVENOUS | Status: AC | PRN
  Administered 2019-06-12: 100 mL via INTRAVENOUS

## 2019-06-12 MED ORDER — SODIUM CHLORIDE 0.9 % IV BOLUS
1000.0000 mL | Freq: Once | INTRAVENOUS | Status: AC
  Administered 2019-06-12: 1000 mL via INTRAVENOUS

## 2019-06-12 MED ORDER — SODIUM CHLORIDE 0.9 % IV BOLUS
500.0000 mL | Freq: Once | INTRAVENOUS | Status: AC
  Administered 2019-06-12: 500 mL via INTRAVENOUS

## 2019-06-12 MED ORDER — ONDANSETRON HCL 4 MG/2ML IJ SOLN
4.0000 mg | Freq: Once | INTRAMUSCULAR | Status: AC
  Administered 2019-06-12: 4 mg via INTRAVENOUS
  Filled 2019-06-12: qty 2

## 2019-06-12 MED ORDER — METOPROLOL TARTRATE 25 MG PO TABS
50.0000 mg | ORAL_TABLET | Freq: Once | ORAL | Status: AC
  Administered 2019-06-12: 50 mg via ORAL
  Filled 2019-06-12: qty 2

## 2019-06-12 MED ORDER — SODIUM CHLORIDE (PF) 0.9 % IJ SOLN
INTRAMUSCULAR | Status: AC
  Filled 2019-06-12: qty 50

## 2019-06-12 MED ORDER — METHIMAZOLE 10 MG PO TABS
10.0000 mg | ORAL_TABLET | Freq: Once | ORAL | Status: AC
  Administered 2019-06-12: 10 mg via ORAL
  Filled 2019-06-12: qty 1

## 2019-06-12 MED ORDER — METOPROLOL TARTRATE 25 MG PO TABS: 25.0000 mg | ORAL_TABLET | Freq: Two times a day (BID) | ORAL | 0 refills | Status: DC

## 2019-06-12 MED ORDER — METOPROLOL TARTRATE 5 MG/5ML IV SOLN: 5.0000 mg | Freq: Once | INTRAVENOUS | Status: DC

## 2019-06-12 MED ORDER — METOPROLOL TARTRATE 5 MG/5ML IV SOLN
5.0000 mg | Freq: Once | INTRAVENOUS | Status: AC
  Administered 2019-06-12: 5 mg via INTRAVENOUS
  Filled 2019-06-12: qty 5

## 2019-06-12 NOTE — Discharge Instructions (Addendum)
Please read and follow all provided instructions.  Your diagnoses today include:  1. Thyrotoxicosis without thyroid storm, unspecified thyrotoxicosis type     Tests performed today include:  Thyroid tests -suggest hyperthyroidism or high thyroid  Vital signs. See below for your results today.   Medications prescribed:   Metoprolol -medication to help control blood pressure and heart rate   Methimazole -medication to treat high thyroid  Take any prescribed medications only as directed.  Home care instructions:  Follow any educational materials contained in this packet.  As we discussed, please discontinue your losartan for the time being so your blood pressure does not get too low.  This may be restarted in the future when your thyroid levels are controlled.  Follow-up instructions: Please see your regular doctor in the next 1 week for recheck of your symptoms and to review your medications.  You should be contacted by the endocrinologist office for recheck in 4 weeks to check your thyroid levels and to continue management and treatment.  Return instructions:   Please return to the Emergency Department if you experience worsening symptoms.   Return if you have a fever, lightheadedness or passout, have persistent chest pain or trouble breathing.  Please return if you have any other emergent concerns.  Additional Information:  Your vital signs today were: BP 106/78   Pulse (!) 111   Temp 97.7 F (36.5 C) (Axillary) Comment: just had a drink of water. was not able to take oraly   Resp 18   Ht 5\' 1"  (1.549 m)   Wt 63.5 kg   SpO2 98%   BMI 26.45 kg/m  If your blood pressure (BP) was elevated above 135/85 this visit, please have this repeated by your doctor within one month. --------------

## 2019-06-12 NOTE — ED Notes (Signed)
Patient to CT scanner

## 2019-06-12 NOTE — ED Provider Notes (Signed)
9:06 AM signout from [employer] at shift change -- patient with multiple complaints including chest pain, shortness of breath, tachycardia, weight loss.  She is found to have undetectable TSH.  Additional thyroid studies ordered.  Patient is receiving beta-blockers for heart rate control.  Her blood pressure is slightly soft and she has received IV fluids.  Overall, she looks well.  She is tolerating her condition fairly well.  She will need close outpatient follow-up.  Awaiting additional thyroid studies to determine treatment regimen.  BP (!) 91/44   Pulse (!) 111   Temp 99.8 F (37.7 C) (Oral)   Resp 20   Ht 5\' 1"  (1.549 m)   Wt 63.5 kg   SpO2 93%   BMI 26.45 kg/m     During ED stay, vitals are stable and patient continues to look well.  Free T4 was 4 times greater than upper limit of normal.  I discussed beta-blocker dosage and methimazole dosing with on-call hospitalist, Dr. Tana Coast. She was extremely helpful in verifying dosing and obtaining follow-up with endocrinology.  Patient will follow up in 4 weeks for recheck of her test.  Discharge to home on metoprolol 25 mg twice daily as well as methimazole 10 mg twice daily.  Patient updated on plan and she is comfortable with discharged home at this time.  Strongly encouraged PCP follow-up 1 week for reassessment of her symptoms.   Carlisle Cater, PA-C 06/13/19 [place], [place], DO 06/13/19 [id number]

## 2019-06-12 NOTE — ED Notes (Signed)
Returned from CT scanner

## 2019-06-12 NOTE — ED Notes (Signed)
to ambulating to restroom

## 2019-06-12 NOTE — ED Provider Notes (Signed)
[facility] DEPT Provider Note   CSN: [id number] Arrival date & time: 06/11/19  1206     History Chief Complaint  Patient presents with  . Abdominal Pain  . Shortness of Breath  . Emesis    Janet Kelley is a 59 y.o. female past medical history significant for anxiety, type 2 diabetes, hyperlipidemia, SVT presents to emergency department today with chief complaint of abdominal pain, shortness of breath and emesis x10 days.  She states she has had to doctor's visits since symptom onset, 1 with PCP in 1 with urgent care.  She had 2 - Covid tests, however no further testing has been done. All of her symptoms have been intermittent however have become more constant in the last several days.  She has had to leave early from work twice this week because she was feeling so poorly. She describes the chest pain is located under bilateral breasts .  She states the pain is sharp.  Pain does not radiate.  She has associated shortness of breath stating at work she is working much slower and not able to move as quickly as she normally does.  She is describing her abdominal pain as located in the periumbilical region and radiates throughout her entire abdomen.  She has had associated nausea and emesis.  In the last 24 hours she has had at least 5 episodes of nonbloody nonbilious emesis.  She is also reporting nonbloody diarrhea. She denies any fever, chills, cough, hemoptysis, sore throat, congestion, headache, neck pain, lower extremity edema.  She states she has multiple coworkers have tested positive for Covid.  Patient also states she supposed to have an outpatient CT this week to follow-up on a left-sided lung nodule found x 6 months ago.  She quit smoking in 2008. PE risk factors include estrogen use.  She denies any long periods of immobilization, recent surgery, family and personal history of clotting disorders.  Of note patient says she has had one episode of SVT in  2013.  She thought she was having panic attack.  She was taken to the hospital and given adenosine with complete resolution of symptoms.  She says she followed up with a cardiologist and had a negative work-up, she has not seen cardiology since. History provided by patient with additional history obtained from chart review.     Past Medical History:  Diagnosis Date  . Anxiety   . Cervical dysplasia   . Diabetes mellitus    Type 2  . Elevated cholesterol   . Endometriosis   . Fibroid   . Vitamin D deficiency December 2014    Patient Active Problem List   Diagnosis Date Noted  . History of vitamin D deficiency 10/24/2015  . History of thyroid cyst 05/09/2015  . Elevated cholesterol   . Anxiety   . Cervical dysplasia   . Diabetes mellitus   . Endometriosis   . SVT (supraventricular tachycardia) ([place]) 09/01/2011    Past Surgical History:  Procedure Laterality Date  . CERVICAL DISC SURGERY    . COLPOSCOPY    . GYNECOLOGIC CRYOSURGERY    . KNEE SURGERY     Arthroscopic  . PELVIC LAPAROSCOPY     DL laser endometriosis  . VAGINAL HYSTERECTOMY  2009   LAV[facility]     OB History    Gravida  0   Para  0   Term  0   Preterm  0   AB  0   Living  0  SAB  0   TAB  0   Ectopic  0   Multiple  0   Live Births  0           Family History  Problem Relation Age of Onset  . Hypertension Mother   . Heart disease Mother   . Diabetes Mother   . Cancer Mother        throat cancer  . Hypertension Sister   . Heart disease Paternal Uncle   . Colon cancer Paternal Grandmother   . Colon cancer Cousin        STOMACH    Social History   Tobacco Use  . Smoking status: Former Smoker    Types: Cigarettes    Quit date: 03/16/2006    Years since quitting: 13.2  . Smokeless tobacco: Never Used  Substance Use Topics  . Alcohol use: Yes    Alcohol/week: 2.0 standard drinks    Types: 2 Standard drinks or equivalent per week    Comment: occasiaonal  . Drug use: No     Home Medications Prior to Admission medications   Medication Sig Start Date End Date Taking? Authorizing Provider  aspirin 81 MG tablet Take 81 mg by mouth daily.    [provider]  busPIRone (BUSPAR) 5 MG tablet Take 1 tablet by mouth 2 (two) times daily. 09/25/16   [provider]  citalopram (CELEXA) 20 MG tablet Take 20 mg by mouth daily.  08/16/11   [provider]  clonazePAM (KLONOPIN) 0.5 MG tablet Take 0.5 mg by mouth daily.  07/15/11   [provider]  esomeprazole (NEXIUM) 20 MG capsule Take 20 mg by mouth daily at 12 noon.    [provider]  Estradiol 10 MCG TABS vaginal tablet Place 1 tablet (10 mcg total) vaginally 2 (two) times a week. 11/06/18   Princess Bruins, MD  ezetimibe (ZETIA) 10 MG tablet Take 10 mg by mouth daily.    [provider]  fexofenadine (ALLEGRA) 180 MG tablet Take 180 mg by mouth daily.    [provider]  JANUMET 50-500 MG per tablet Take 1 tablet by mouth daily.  08/11/11   [provider]  losartan (COZAAR) 25 MG tablet Take 25 mg by mouth daily.    [provider]  rosuvastatin (CRESTOR) 20 MG tablet Take 20 mg by mouth daily.    [provider]  Vitamin D, Ergocalciferol, (DRISDOL) 50000 UNITS CAPS capsule Take 1 capsule (50,000 Units total) by mouth every 7 (seven) days. 11/28/13   Terrance Mass, MD    Allergies    Ceclor [cefaclor]  Review of Systems   Review of Systems All other systems are reviewed and are negative for acute change except as noted in the HPI.  Physical Exam Updated Vital Signs BP (!) 152/85 (BP Location: Left Arm)   Pulse (!) 140   Temp 99.8 F (37.7 C) (Oral)   Resp 14   Ht 5\' 1"  (1.549 m)   Wt 63.5 kg   SpO2 99%   BMI 26.45 kg/m   Physical Exam Vitals and nursing note reviewed.  Constitutional:      General: She is not in acute distress.    Appearance: She is not ill-appearing.  HENT:     Head: Normocephalic and  atraumatic.     Right Ear: Tympanic membrane and external ear normal.     Left Ear: Tympanic membrane and external ear normal.     Nose: Nose normal.  Mouth/Throat:     Mouth: Mucous membranes are moist.     Pharynx: Oropharynx is clear.  Eyes:     General: No scleral icterus.       Right eye: No discharge.        Left eye: No discharge.     Extraocular Movements: Extraocular movements intact.     Conjunctiva/sclera: Conjunctivae normal.     Pupils: Pupils are equal, round, and reactive to light.  Neck:     Vascular: No JVD.  Cardiovascular:     Rate and Rhythm: Regular rhythm. Tachycardia present.     Pulses: Normal pulses.          Radial pulses are 2+ on the right side and 2+ on the left side.     Heart sounds: Normal heart sounds.     Comments: Heart rate 120-130 during exam Pulmonary:     Comments: Lungs clear to auscultation in all fields. Symmetric chest rise. No wheezing, rales, or rhonchi. She is speaking in full sentences, no signs of respiratory distress. Chest:       Comments: Tenderness to palpation as depicted in image above. No crepitus or deformity Abdominal:     Tenderness: There is no right CVA tenderness or left CVA tenderness.     Comments: Abdomen is soft, non-distended, generalized abdominal tenderness. No rigidity, no guarding. No peritoneal signs.  Musculoskeletal:        General: Normal range of motion.     Cervical back: Normal range of motion.     Right lower leg: No edema.     Left lower leg: No edema.  Skin:    General: Skin is warm and dry.     Capillary Refill: Capillary refill takes less than 2 seconds.  Neurological:     Mental Status: She is oriented to person, place, and time.     GCS: GCS eye subscore is 4. GCS verbal subscore is 5. GCS motor subscore is 6.     Comments: Fluent speech, no facial droop.  Psychiatric:        Behavior: Behavior normal.       ED Results / Procedures / Treatments   Labs (all labs ordered are listed,  but only abnormal results are displayed) Labs Reviewed  COMPREHENSIVE METABOLIC PANEL - Abnormal; Notable for the following components:      Result Value   Potassium 3.2 (*)    Glucose, Bld 200 (*)    BUN 21 (*)    Calcium 10.5 (*)    ALT 120 (*)    Anion gap 16 (*)    All other components within normal limits  URINALYSIS, ROUTINE W REFLEX MICROSCOPIC - Abnormal; Notable for the following components:   Glucose, UA >=500 (*)    Ketones, ur 5 (*)    All other components within normal limits  TSH - Abnormal; Notable for the following components:   TSH <0.010 (*)    All other components within normal limits  I-STAT BETA HCG BLOOD, ED ([facility], [facility], [facility] ONLY) - Abnormal; Notable for the following components:   I-stat hCG, quantitative 13.3 (*)    All other components within normal limits  LIPASE, BLOOD  CBC  T3  T4, FREE  TROPONIN I (HIGH SENSITIVITY)  TROPONIN I (HIGH SENSITIVITY)    EKG EKG Interpretation  Date/Time:  Monday June 11 2019 12:19:11 EST Ventricular Rate:  133 PR Interval:  142 QRS Duration: 72 QT Interval:  286 QTC Calculation: 425 R Axis:  60 Text Interpretation: Sinus tachycardia Right atrial enlargement Since last tracing 01 May 2007 Septal infarct , age undetermined Confirmed by Rolland Porter [phone]) on 06/11/2019 11:16:34 PM   Radiology CT Angio Chest PE W and/or Wo Contrast  Result Date: 06/12/2019 CLINICAL DATA:  59 year old female with shortness of breath and abdominal pain and nausea vomiting. EXAM: CT ANGIOGRAPHY CHEST CT ABDOMEN AND PELVIS WITH CONTRAST TECHNIQUE: Multidetector CT imaging of the chest was performed using the standard protocol during bolus administration of intravenous contrast. Multiplanar CT image reconstructions and MIPs were obtained to evaluate the vascular anatomy. Multidetector CT imaging of the abdomen and pelvis was performed using the standard protocol during bolus administration of intravenous contrast. CONTRAST:  188mL OMNIPAQUE  IOHEXOL 350 MG/ML SOLN COMPARISON:  Chest CT dated 03/09/2017. FINDINGS: CTA CHEST FINDINGS Cardiovascular: There is no cardiomegaly or pericardial effusion. The thoracic aorta is unremarkable. The visualized origins of the great vessels of the aortic arch appear patent. Evaluation of the pulmonary arteries is limited due to respiratory motion artifact. No pulmonary artery embolus identified. Mediastinum/Nodes: There is no hilar or mediastinal adenopathy. The esophagus and the thyroid gland are grossly unremarkable. No mediastinal fluid collection. Lungs/Pleura: There is a 7 mm left lower lobe subpleural nodule stable since the study of 2018. No focal consolidation, pleural effusion, or pneumothorax. The central airways are patent. Musculoskeletal: Scoliosis and degenerative changes of the spine. No acute osseous pathology. Review of the MIP images confirms the above findings. CT ABDOMEN and PELVIS FINDINGS No intra-abdominal free air or free fluid. Hepatobiliary: No focal liver abnormality is seen. No gallstones, gallbladder wall thickening, or biliary dilatation. Pancreas: Unremarkable. No pancreatic ductal dilatation or surrounding inflammatory changes. Spleen: Normal in size without focal abnormality. Adrenals/Urinary Tract: The adrenal glands are unremarkable. There is no hydronephrosis on either side. There is symmetric enhancement and excretion of contrast by both kidneys. A 4 mm calcific focus in the left hemipelvis along the expected course of the distal left ureter noted. This may represent a distal left ureteral calculus or a pelvic phlebolith. The urinary bladder is unremarkable. Stomach/Bowel: Mildly thickened appearance of several loops of small bowel may be related to underdistention or represent mild enteritis. Clinical correlation is recommended. No bowel obstruction. The appendix is normal. Vascular/Lymphatic: Mild aortoiliac atherosclerotic disease. The IVC is unremarkable. No portal venous gas.  There is no adenopathy. Reproductive: Hysterectomy. No adnexal masses. Other: None Musculoskeletal: Scoliosis. Multilevel lower lumbar facet arthropathy. No acute osseous pathology. Review of the MIP images confirms the above findings. IMPRESSION: 1. No acute intrathoracic pathology. No CT evidence of pulmonary artery embolus. 2. A 4 mm pelvic phleboliths versus less likely a distal left ureteral calculus. No hydronephrosis. 3. Mildly thickened appearance of several loops of small bowel may be related to underdistention or represent mild enteritis. Clinical correlation is recommended. No bowel obstruction. Normal appendix. 4. Aortic Atherosclerosis (ICD10-I70.0). Electronically Signed   By: Anner Crete M.D.   On: 06/12/2019 03:13   CT ABDOMEN PELVIS W CONTRAST  Result Date: 06/12/2019 CLINICAL DATA:  59 year old female with shortness of breath and abdominal pain and nausea vomiting. EXAM: CT ANGIOGRAPHY CHEST CT ABDOMEN AND PELVIS WITH CONTRAST TECHNIQUE: Multidetector CT imaging of the chest was performed using the standard protocol during bolus administration of intravenous contrast. Multiplanar CT image reconstructions and MIPs were obtained to evaluate the vascular anatomy. Multidetector CT imaging of the abdomen and pelvis was performed using the standard protocol during bolus administration of intravenous contrast. CONTRAST:  173mL OMNIPAQUE  IOHEXOL 350 MG/ML SOLN COMPARISON:  Chest CT dated 03/09/2017. FINDINGS: CTA CHEST FINDINGS Cardiovascular: There is no cardiomegaly or pericardial effusion. The thoracic aorta is unremarkable. The visualized origins of the great vessels of the aortic arch appear patent. Evaluation of the pulmonary arteries is limited due to respiratory motion artifact. No pulmonary artery embolus identified. Mediastinum/Nodes: There is no hilar or mediastinal adenopathy. The esophagus and the thyroid gland are grossly unremarkable. No mediastinal fluid collection. Lungs/Pleura:  There is a 7 mm left lower lobe subpleural nodule stable since the study of 2018. No focal consolidation, pleural effusion, or pneumothorax. The central airways are patent. Musculoskeletal: Scoliosis and degenerative changes of the spine. No acute osseous pathology. Review of the MIP images confirms the above findings. CT ABDOMEN and PELVIS FINDINGS No intra-abdominal free air or free fluid. Hepatobiliary: No focal liver abnormality is seen. No gallstones, gallbladder wall thickening, or biliary dilatation. Pancreas: Unremarkable. No pancreatic ductal dilatation or surrounding inflammatory changes. Spleen: Normal in size without focal abnormality. Adrenals/Urinary Tract: The adrenal glands are unremarkable. There is no hydronephrosis on either side. There is symmetric enhancement and excretion of contrast by both kidneys. A 4 mm calcific focus in the left hemipelvis along the expected course of the distal left ureter noted. This may represent a distal left ureteral calculus or a pelvic phlebolith. The urinary bladder is unremarkable. Stomach/Bowel: Mildly thickened appearance of several loops of small bowel may be related to underdistention or represent mild enteritis. Clinical correlation is recommended. No bowel obstruction. The appendix is normal. Vascular/Lymphatic: Mild aortoiliac atherosclerotic disease. The IVC is unremarkable. No portal venous gas. There is no adenopathy. Reproductive: Hysterectomy. No adnexal masses. Other: None Musculoskeletal: Scoliosis. Multilevel lower lumbar facet arthropathy. No acute osseous pathology. Review of the MIP images confirms the above findings. IMPRESSION: 1. No acute intrathoracic pathology. No CT evidence of pulmonary artery embolus. 2. A 4 mm pelvic phleboliths versus less likely a distal left ureteral calculus. No hydronephrosis. 3. Mildly thickened appearance of several loops of small bowel may be related to underdistention or represent mild enteritis. Clinical  correlation is recommended. No bowel obstruction. Normal appendix. 4. Aortic Atherosclerosis (ICD10-I70.0). Electronically Signed   By: Anner Crete M.D.   On: 06/12/2019 03:13    Procedures Procedures (including critical care time)  Medications Ordered in ED Medications  sodium chloride (PF) 0.9 % injection (  Canceled Entry 06/12/19 0256)  sodium chloride 0.9 % bolus 1,000 mL (0 mLs Intravenous Stopped 06/12/19 0256)  iohexol (OMNIPAQUE) 350 MG/ML injection 100 mL (100 mLs Intravenous Contrast Given 06/12/19 0235)  metoprolol tartrate (LOPRESSOR) injection 5 mg (5 mg Intravenous Given 06/12/19 0425)  sodium chloride 0.9 % bolus 1,000 mL (0 mLs Intravenous Stopped 06/12/19 0535)  ondansetron (ZOFRAN) injection 4 mg (4 mg Intravenous Given 06/12/19 0425)  sodium chloride 0.9 % bolus 500 mL (500 mLs Intravenous New Bag/Given 06/12/19 0705)  metoprolol tartrate (LOPRESSOR) tablet 50 mg (50 mg Oral Given 06/12/19 [id number])    ED Course  I have reviewed the triage vital signs and the nursing notes.  Pertinent labs & imaging results that were available during my care of the patient were reviewed by me and considered in my medical decision making (see chart for details). Vitals:   06/12/19 0530 06/12/19 0600 06/12/19 0630 06/12/19 0700  BP: (!) 104/52 (!) 89/51 (!) 91/53 111/61  Pulse: (!) 118 (!) 117 (!) 119 (!) 115  Resp: (!) 21 14 (!) 36 18  Temp:  TempSrc:      SpO2: 93% 96% 99% 98%  Weight:      Height:          MDM Rules/Calculators/A&P                     Patient seen and examined upon being placed in exam room.  Unfortunately she had prolonged wait time in the lobby of 12 hours.  In triage she was noted to have temperature of 98, heart rate of 132 and blood pressure of 98/65, no hypoxia.  On my exam she is still afebrile 98.7, BP 115/59, heart rate ranging 120-130, SpO2 99% on room air. She is nontoxic appearing, does not appear to be septic.  She has normal work of breathing and lungs  are clear to auscultation in all fields.  She has generalized abdominal tenderness, no peritoneal signs.   Her labs were collected in triage.  I reviewed results.  CBC without leukocytosis, no anemia.  CMP is notable for calcium mildly elevated 10.5, glucose of 200, ALT of 120, anion gap of 16. No severe electrolyte derangement, renal insufficiency.  Lipase is 33.  Beta hCG is 13.3, patient had hysterectomy, is not pregnant.  Delta troponin flat.  UA is without signs of infection.  EKG without ischemia. Based on labs patients and patient being afebrile, doubt sepsis. DDX includes covid, SVT, pneumonia, PE, gastritis, appendicitis, diverticulitis, kidney stone, covid. Feel that covid is less likely as she has had 2 recent outpatient negative tests since symptom onset.  TSH <.010 suggesting hyperthyroidism.  Updated patient on labs and imaging results.  She states that she was told she had a nodule on her thyroid over 5 years ago when she had an ultrasound but was never started on any medications.  Added on T3 and T4 labs to help with outpatient follow-up. CTA is negative for PE. CT A/P shows mildly thickened appearance of several loops of small bowel. No bowel obstruction. Radiologist comments this may be related to underdistention or represent mild enteritis.  No bowel obstruction. Case discussed with ED attending Dr. Tomi Bamberger. Patient has had soft blood pressures.  She was given 1 dose of s 5 mg Lopressor with only slight improvement of tachycardia.  She has received 2 L of IV fluids.  Will give another small bolus and Lopressor 50 mg p.o.   Patient care transferred to Alvera Singh PA-C at the end of my shift pending reassessment after second dose of lopressor. Patient presentation, ED course, and plan of care discussed with review of all pertinent labs and imaging. Please see his note for further details regarding further ED course and disposition.   Final Clinical Impression(s) / ED Diagnoses Final  diagnoses:  Hyperthyroidism    Rx / DC Orders ED Discharge Orders    None       Cherre Robins, PA-C 06/12/19 [id number]    Rolland Porter, MD 06/15/19 [phone]

## 2019-06-13 LAB — T3: T3, Total: 319 ng/dL — ABNORMAL HIGH (ref 71–180)

## 2019-06-14 ENCOUNTER — Other Ambulatory Visit: Payer: Managed Care, Other (non HMO)

## 2019-07-05 ENCOUNTER — Other Ambulatory Visit: Payer: Self-pay

## 2019-07-09 ENCOUNTER — Telehealth: Payer: Self-pay | Admitting: Endocrinology

## 2019-07-09 ENCOUNTER — Other Ambulatory Visit: Payer: Self-pay

## 2019-07-09 ENCOUNTER — Ambulatory Visit: Payer: Managed Care, Other (non HMO) | Admitting: Endocrinology

## 2019-07-09 ENCOUNTER — Encounter: Payer: Self-pay | Admitting: Endocrinology

## 2019-07-09 DIAGNOSIS — E0590 Thyrotoxicosis, unspecified without thyrotoxic crisis or storm: Secondary | ICD-10-CM | POA: Diagnosis not present

## 2019-07-09 LAB — T4, FREE: Free T4: 1.51 ng/dL (ref 0.60–1.60)

## 2019-07-09 LAB — TSH: TSH: <0.01 u[IU]/mL — ABNORMAL LOW (ref 0.35–4.50)

## 2019-07-09 NOTE — Telephone Encounter (Signed)
Please review and advise.

## 2019-07-09 NOTE — Progress Notes (Signed)
Subjective:    Patient ID: Janet Kelley, female    DOB: 12-16-1960, 59 y.o.   MRN: [id number]  HPI Pt is referred by Lennie Odor, PA, for hyperthyroidism.  Pt reports he was dx'ed with hyperthyroidism 2 weeks ago (TFT were normal in 2017).  She had a thyroid nodule bx in approx 1995.  Result is not available, but she says this was benign.  she was rx'ed tapazole and metoprolol.  she has never had XRT to the anterior neck, or thyroid surgery. She has C-spine procedure in the past. she does not consume kelp or any other non-prescribed thyroid medication.  she has never been on amiodarone.  Since on rx, pt states she feels better in general.   Past Medical History:  Diagnosis Date  . Anxiety   . Cervical dysplasia   . Diabetes mellitus    Type 2  . Elevated cholesterol   . Endometriosis   . Fibroid   . Vitamin D deficiency December 2014    Past Surgical History:  Procedure Laterality Date  . CERVICAL DISC SURGERY    . COLPOSCOPY    . GYNECOLOGIC CRYOSURGERY    . KNEE SURGERY     Arthroscopic  . PELVIC LAPAROSCOPY     DL laser endometriosis  . VAGINAL HYSTERECTOMY  2009   [facility]    Social History   Socioeconomic History  . Marital status: Single    Spouse name: Not on file  . Number of children: Not on file  . Years of education: Not on file  . Highest education level: Not on file  Occupational History  . Not on file  Tobacco Use  . Smoking status: Former Smoker    Types: Cigarettes    Quit date: 03/16/2006    Years since quitting: 13.3  . Smokeless tobacco: Never Used  Substance and Sexual Activity  . Alcohol use: Yes    Alcohol/week: 2.0 standard drinks    Types: 2 Standard drinks or equivalent per week    Comment: occasiaonal  . Drug use: No  . Sexual activity: Yes    Partners: Male    Birth control/protection: Surgical    Comment: 1st intercourse- 18, partners- greater than 5  Other Topics Concern  . Not on file  Social History Narrative  . Not on  file   Social Determinants of Health   Financial Resource Strain:   . Difficulty of Paying Living Expenses: Not on file  Food Insecurity:   . Worried About Charity fundraiser in the Last Year: Not on file  . Ran Out of Food in the Last Year: Not on file  Transportation Needs:   . Lack of Transportation (Medical): Not on file  . Lack of Transportation (Non-Medical): Not on file  Physical Activity:   . Days of Exercise per Week: Not on file  . Minutes of Exercise per Session: Not on file  Stress:   . Feeling of Stress : Not on file  Social Connections:   . Frequency of Communication with Friends and Family: Not on file  . Frequency of Social Gatherings with Friends and Family: Not on file  . Attends Religious Services: Not on file  . Active Member of Clubs or Organizations: Not on file  . Attends Archivist Meetings: Not on file  . Marital Status: Not on file  Intimate Partner Violence:   . Fear of Current or Ex-Partner: Not on file  . Emotionally Abused: Not on file  .  Physically Abused: Not on file  . Sexually Abused: Not on file    Current Outpatient Medications on File Prior to Visit  Medication Sig Dispense Refill  . amphetamine-dextroamphetamine (ADDERALL XR) 10 MG 24 hr capsule Take 10 mg by mouth 2 (two) times daily. Take at 8:00 and 12 noon    . aspirin 81 MG tablet Take 81 mg by mouth daily.    . busPIRone (BUSPAR) 5 MG tablet Take 1 tablet by mouth 2 (two) times daily.    . citalopram (CELEXA) 40 MG tablet Take 40 mg by mouth daily.     . clonazePAM (KLONOPIN) 0.5 MG tablet Take 0.5 mg by mouth at bedtime.     Marland Kitchen esomeprazole (NEXIUM) 20 MG capsule Take 20 mg by mouth daily at 12 noon.    . Estradiol 10 MCG TABS vaginal tablet Place 1 tablet (10 mcg total) vaginally 2 (two) times a week. (Patient taking differently: Place 1 tablet vaginally 2 (two) times a week. Tuesday and Friday) 24 tablet 4  . ezetimibe (ZETIA) 10 MG tablet Take 10 mg by mouth at bedtime.      . fexofenadine (ALLEGRA) 180 MG tablet Take 180 mg by mouth daily.    . methimazole (TAPAZOLE) 10 MG tablet Take 1 tablet (10 mg total) by mouth 2 (two) times daily. 60 tablet 1  . metoprolol tartrate (LOPRESSOR) 25 MG tablet Take 1 tablet (25 mg total) by mouth 2 (two) times daily. 60 tablet 0  . promethazine (PHENERGAN) 25 MG tablet Take 25 mg by mouth daily as needed.    . rosuvastatin (CRESTOR) 40 MG tablet Take 40 mg by mouth at bedtime.     . sitaGLIPtin-metformin (JANUMET) 50-1000 MG tablet Take 1 tablet by mouth daily.     . Vitamin D, Ergocalciferol, (DRISDOL) 50000 UNITS CAPS capsule Take 1 capsule (50,000 Units total) by mouth every 7 (seven) days. 12 capsule 0   No current facility-administered medications on file prior to visit.    Allergies  Allergen Reactions  . Ceclor [Cefaclor] Rash    Family History  Problem Relation Age of Onset  . Hypertension Mother   . Heart disease Mother   . Diabetes Mother   . Cancer Mother        throat cancer  . Hyperthyroidism Father   . Hypertension Sister   . Heart disease Paternal Uncle   . Colon cancer Paternal Grandmother   . Colon cancer Cousin        STOMACH    BP 110/68 (BP Location: Left Arm, Patient Position: Sitting, Cuff Size: Normal)   Pulse 96   Ht 5\' 1"  (1.549 m)   Wt 139 lb 12.8 oz (63.4 kg)   SpO2 98%   BMI 26.41 kg/m    Review of Systems denies hoarseness, diplopia, diarrhea, and excessive diaphoresis.  She has lost 30 lbs x 6 mos. She has intermitt headache, anxiety, muscle weakness, tremor, heat intolerance, DOE, and palpitations.       Objective:   Physical Exam VS: see vs page GEN: no distress HEAD: head: no deformity eyes: no periorbital swelling, no proptosis external nose and ears are normal NECK: a healed scar is present.  I do not appreciate a nodule in the thyroid or elsewhere in the neck CHEST WALL: no deformity LUNGS: clear to auscultation CV: reg rate and rhythm, no  murmur MUSCULOSKELETAL: muscle bulk and strength are grossly normal.  no obvious joint swelling.  gait is normal and steady.  EXTEMITIES: no deformity.  no leg edema PULSES: no carotid bruit NEURO:  cn 2-12 grossly intact.   readily moves all 4's.  sensation is intact to touch on all 4's.  No tremor SKIN:  Normal texture and temperature.  No rash or suspicious lesion is visible.  Not diaphoretic.   NODES:  None palpable at the neck PSYCH: alert, well-oriented.  Does not appear anxious nor depressed.    Lab Results  Component Value Date   TSH <0.010 (L) 06/12/2019   T3TOTAL 319 (H) 06/12/2019   CT chest; thyroid is normal  I have reviewed outside records, and summarized: Pt was noted to have abnormal TFT, and referred here.  She was seen in ER for n/v/fatigue     Assessment & Plan:  Hyperthyroidism, new to me.  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    Patient Instructions  Blood tests are requested for you today.  We'll let you know about the results.  If ever you have fever while taking methimazole, stop it and call us, even if the reason is obvious, because of the risk of a rare side-effect.  It is best to never miss the medication.  However, if you do miss it, next best is to double up the next time.   We could check the thyoid ultrasound.  However, I would not spend the money on it if I was you, as it would not change anything we do now.  Please come back for a follow-up appointment in 1 month.        Hyperthyroidism  Hyperthyroidism is when the thyroid gland is too active (overactive). The thyroid gland is a small gland located in the lower front part of the neck, just in front of the windpipe (trachea). This gland makes hormones that help control how the body uses food for energy (metabolism) as well as how the heart and brain function. These hormones also play a role in keeping your bones strong. When the thyroid is overactive, it  produces too much of a hormone called thyroxine. What are the causes? This condition may be caused by:  Graves' disease. This is a disorder in which the body's disease-fighting system (immune system) attacks the thyroid gland. This is the most common cause.  Inflammation of the thyroid gland.  A tumor in the thyroid gland.  Use of certain medicines, including: ? Prescription thyroid hormone replacement. ? Herbal supplements that mimic thyroid hormones. ? Amiodarone therapy.  Solid or fluid-filled lumps within your thyroid gland (thyroid nodules).  Taking in a large amount of iodine from foods or medicines. What increases the risk? You are more likely to develop this condition if:  You are female.  You have a family history of thyroid conditions.  You smoke tobacco.  You use a medicine called lithium.  You take medicines that affect the immune system (immunosuppressants). What are the signs or symptoms? Symptoms of this condition include:  Nervousness.  Inability to tolerate heat.  Unexplained weight loss.  Diarrhea.  Change in the texture of hair or skin.  Heart skipping beats or making extra beats.  Rapid heart rate.  Loss of menstruation.  Shaky hands.  Fatigue.  Restlessness.  Sleep problems.  Enlarged thyroid gland or a lump in the thyroid (nodule). You may also have symptoms of Graves' disease, which may include:  Protruding eyes.  Dry eyes.  Red or swollen eyes.  Problems with vision. How is this diagnosed? This condition  may be diagnosed based on:  Your symptoms and medical history.  A physical exam.  Blood tests.  Thyroid ultrasound. This test involves using sound waves to produce images of the thyroid gland.  A thyroid scan. A radioactive substance is injected into a vein, and images show how much iodine is present in the thyroid.  Radioactive iodine uptake test (RAIU). A small amount of radioactive iodine is given by mouth to  see how much iodine the thyroid absorbs after a certain amount of time. How is this treated? Treatment depends on the cause and severity of the condition. Treatment may include:  Medicines to reduce the amount of thyroid hormone your body makes.  Radioactive iodine treatment (radioiodine therapy). This involves swallowing a small dose of radioactive iodine, in capsule or liquid form, to kill thyroid cells.  Surgery to remove part or all of your thyroid gland. You may need to take thyroid hormone replacement medicine for the rest of your life after thyroid surgery.  Medicines to help manage your symptoms. Follow these instructions at home:   Take over-the-counter and prescription medicines only as told by your health care provider.  Do not use any products that contain nicotine or tobacco, such as cigarettes and e-cigarettes. If you need help quitting, ask your health care provider.  Follow any instructions from your health care provider about diet. You may be instructed to limit foods that contain iodine.  Keep all follow-up visits as told by your health care provider. This is important. ? You will need to have blood tests regularly so that your health care provider can monitor your condition. Contact a health care provider if:  Your symptoms do not get better with treatment.  You have a fever.  You are taking thyroid hormone replacement medicine and you: ? Have symptoms of depression. ? Feel like you are tired all the time. ? Gain weight. Get help right away if:  You have chest pain.  You have decreased alertness or a change in your awareness.  You have abdominal pain.  You feel dizzy.  You have a rapid heartbeat.  You have an irregular heartbeat.  You have difficulty breathing. Summary  The thyroid gland is a small gland located in the lower front part of the neck, just in front of the windpipe (trachea).  Hyperthyroidism is when the thyroid gland is too active  (overactive) and produces too much of a hormone called thyroxine.  The most common cause is Graves' disease, a disorder in which your immune system attacks the thyroid gland.  Hyperthyroidism can cause various symptoms, such as unexplained weight loss, nervousness, inability to tolerate heat, or changes in your heartbeat.  Treatment may include medicine to reduce the amount of thyroid hormone your body makes, radioiodine therapy, surgery, or medicines to manage symptoms. This information is not intended to replace advice given to you by your health care provider. Make sure you discuss any questions you have with your health care provider. Document Revised: 05/06/2017 Document Reviewed: 05/04/2017 Elsevier Patient Education  2020 [employer].

## 2019-07-09 NOTE — Telephone Encounter (Signed)
Patient ph# [phone] called re: Patient requests that the letter Dr. Loanne Drilling is writing for patient to be off work be written for patient to return to work on 07/16/19.  Also patient requests a new Rx for something to help her sleep be sent to:   [facility] [phone] - [address], [address] Phone:  [phone]  Fax:  [phone]

## 2019-07-09 NOTE — Patient Instructions (Addendum)
Blood tests are requested for you today.  We'll let you know about the results.  If ever you have fever while taking methimazole, stop it and call us, even if the reason is obvious, because of the risk of a rare side-effect.  It is best to never miss the medication.  However, if you do miss it, next best is to double up the next time.   We could check the thyoid ultrasound.  However, I would not spend the money on it if I was you, as it would not change anything we do now.  Please come back for a follow-up appointment in 1 month.        Hyperthyroidism  Hyperthyroidism is when the thyroid gland is too active (overactive). The thyroid gland is a small gland located in the lower front part of the neck, just in front of the windpipe (trachea). This gland makes hormones that help control how the body uses food for energy (metabolism) as well as how the heart and brain function. These hormones also play a role in keeping your bones strong. When the thyroid is overactive, it produces too much of a hormone called thyroxine. What are the causes? This condition may be caused by:  Graves' disease. This is a disorder in which the body's disease-fighting system (immune system) attacks the thyroid gland. This is the most common cause.  Inflammation of the thyroid gland.  A tumor in the thyroid gland.  Use of certain medicines, including: ? Prescription thyroid hormone replacement. ? Herbal supplements that mimic thyroid hormones. ? Amiodarone therapy.  Solid or fluid-filled lumps within your thyroid gland (thyroid nodules).  Taking in a large amount of iodine from foods or medicines. What increases the risk? You are more likely to develop this condition if:  You are female.  You have a family history of thyroid conditions.  You smoke tobacco.  You use a medicine called lithium.  You take medicines that affect the immune system (immunosuppressants). What are the signs or  symptoms? Symptoms of this condition include:  Nervousness.  Inability to tolerate heat.  Unexplained weight loss.  Diarrhea.  Change in the texture of hair or skin.  Heart skipping beats or making extra beats.  Rapid heart rate.  Loss of menstruation.  Shaky hands.  Fatigue.  Restlessness.  Sleep problems.  Enlarged thyroid gland or a lump in the thyroid (nodule). You may also have symptoms of Graves' disease, which may include:  Protruding eyes.  Dry eyes.  Red or swollen eyes.  Problems with vision. How is this diagnosed? This condition may be diagnosed based on:  Your symptoms and medical history.  A physical exam.  Blood tests.  Thyroid ultrasound. This test involves using sound waves to produce images of the thyroid gland.  A thyroid scan. A radioactive substance is injected into a vein, and images show how much iodine is present in the thyroid.  Radioactive iodine uptake test (RAIU). A small amount of radioactive iodine is given by mouth to see how much iodine the thyroid absorbs after a certain amount of time. How is this treated? Treatment depends on the cause and severity of the condition. Treatment may include:  Medicines to reduce the amount of thyroid hormone your body makes.  Radioactive iodine treatment (radioiodine therapy). This involves swallowing a small dose of radioactive iodine, in capsule or liquid form, to kill thyroid cells.  Surgery to remove part or all of your thyroid gland. You may need to take thyroid  hormone replacement medicine for the rest of your life after thyroid surgery.  Medicines to help manage your symptoms. Follow these instructions at home:   Take over-the-counter and prescription medicines only as told by your health care provider.  Do not use any products that contain nicotine or tobacco, such as cigarettes and e-cigarettes. If you need help quitting, ask your health care provider.  Follow any instructions  from your health care provider about diet. You may be instructed to limit foods that contain iodine.  Keep all follow-up visits as told by your health care provider. This is important. ? You will need to have blood tests regularly so that your health care provider can monitor your condition. Contact a health care provider if:  Your symptoms do not get better with treatment.  You have a fever.  You are taking thyroid hormone replacement medicine and you: ? Have symptoms of depression. ? Feel like you are tired all the time. ? Gain weight. Get help right away if:  You have chest pain.  You have decreased alertness or a change in your awareness.  You have abdominal pain.  You feel dizzy.  You have a rapid heartbeat.  You have an irregular heartbeat.  You have difficulty breathing. Summary  The thyroid gland is a small gland located in the lower front part of the neck, just in front of the windpipe (trachea).  Hyperthyroidism is when the thyroid gland is too active (overactive) and produces too much of a hormone called thyroxine.  The most common cause is Graves' disease, a disorder in which your immune system attacks the thyroid gland.  Hyperthyroidism can cause various symptoms, such as unexplained weight loss, nervousness, inability to tolerate heat, or changes in your heartbeat.  Treatment may include medicine to reduce the amount of thyroid hormone your body makes, radioiodine therapy, surgery, or medicines to manage symptoms. This information is not intended to replace advice given to you by your health care provider. Make sure you discuss any questions you have with your health care provider. Document Revised: 05/06/2017 Document Reviewed: 05/04/2017 Elsevier Patient Education  2020 [employer].

## 2019-07-09 NOTE — Telephone Encounter (Signed)
done

## 2019-07-10 ENCOUNTER — Telehealth: Payer: Self-pay

## 2019-07-10 NOTE — Telephone Encounter (Signed)
I was able to locate letter. However, I did not see a Rx nor response re: request for Rx to help her sleep. Please advise

## 2019-07-10 NOTE — Telephone Encounter (Signed)
This is a question for your primary care provider. However, the thyroid is much better, so I hope you will sleep better soon.

## 2019-07-10 NOTE — Telephone Encounter (Signed)
FYI

## 2019-07-10 NOTE — Telephone Encounter (Signed)
Patient calling today to give Dr. Loanne Drilling verbal permission to speak with Disability representative-patient was told she could do this and wanted to let you know that disability should be calling him soon to discuss patient

## 2019-07-10 NOTE — Telephone Encounter (Signed)
Called pt and made her aware of Dr. Cordelia Pen response re: request for sleep aid. Verbalized acceptance and understanding.

## 2019-07-12 LAB — HEMOGLOBIN A1C: Hemoglobin A1C: 6.1

## 2019-07-12 LAB — BASIC METABOLIC PANEL
BUN: 6 (ref 4–21)
Creatinine: 0.5 (ref 0.5–1.1)

## 2019-07-13 ENCOUNTER — Other Ambulatory Visit: Payer: Self-pay

## 2019-07-13 ENCOUNTER — Ambulatory Visit: Payer: Managed Care, Other (non HMO) | Admitting: Podiatry

## 2019-07-13 ENCOUNTER — Encounter: Payer: Self-pay | Admitting: Podiatry

## 2019-07-13 DIAGNOSIS — M79675 Pain in left toe(s): Secondary | ICD-10-CM

## 2019-07-13 DIAGNOSIS — E119 Type 2 diabetes mellitus without complications: Secondary | ICD-10-CM | POA: Insufficient documentation

## 2019-07-13 DIAGNOSIS — M79674 Pain in right toe(s): Secondary | ICD-10-CM

## 2019-07-13 DIAGNOSIS — B351 Tinea unguium: Secondary | ICD-10-CM

## 2019-07-13 NOTE — Progress Notes (Signed)
This patient presents to the office with chief complaint of long thick nails and diabetic feet. Patient says she has been diabetic for 10-12 years.  This patient  says there  is  no pain and discomfort in her  feet.  This patient says there are long thick painful nails.  These nails are painful walking and wearing shoes.  Patient has no history of infection or drainage from both feet.  Patient is unable to  self treat his own nails . This patient presents  to the office today for treatment of the  long nails and a foot evaluation due to history of  diabetes.  General Appearance  Alert, conversant and in no acute stress.  Vascular  Dorsalis pedis and posterior tibial  pulses are palpable  bilaterally.  Capillary return is within normal limits  bilaterally. Temperature is within normal limits  bilaterally.  Neurologic  Senn-Weinstein monofilament wire test within normal limits  bilaterally. Muscle power within normal limits bilaterally.  Nails Thick disfigured discolored nails with subungual debris  from hallux to fifth toes bilaterally. No evidence of bacterial infection or drainage bilaterally.  Orthopedic  No limitations of motion of motion feet .  No crepitus or effusions noted.  No bony pathology or digital deformities noted.  Skin  normotropic skin with no porokeratosis noted bilaterally.  No signs of infections or ulcers noted.     Onychomycosis  Diabetes with no foot complications  IE  Debride nails x 10.  A diabetic foot exam was performed and there is no evidence of any vascular or neurologic pathology.   RTC prn.   Gardiner Barefoot DPM

## 2019-07-16 ENCOUNTER — Telehealth: Payer: Self-pay

## 2019-07-16 NOTE — Telephone Encounter (Signed)
Release of information received and faxed to Medical Records for processing.

## 2019-07-16 NOTE — Telephone Encounter (Signed)
Company: [employer]  Document: Disability Forms Other records requested: None requested  All above requested information has been faxed successfully to [employer] listed above. Documents and fax confirmation have been placed in the faxed file for future reference.

## 2019-07-17 ENCOUNTER — Other Ambulatory Visit: Payer: Self-pay | Admitting: Endocrinology

## 2019-07-17 MED ORDER — METOPROLOL TARTRATE 25 MG PO TABS: 25.0000 mg | ORAL_TABLET | Freq: Two times a day (BID) | ORAL | 3 refills | Status: DC

## 2019-07-19 ENCOUNTER — Telehealth: Payer: Self-pay

## 2019-07-19 NOTE — Telephone Encounter (Signed)
Company: [employer]  Document: Request for office notes Other records requested: Office notes  All above requested information has been faxed successfully to [employer] listed above. Documents and fax confirmation have been placed in the faxed file for future reference.

## 2019-08-08 ENCOUNTER — Other Ambulatory Visit: Payer: Self-pay

## 2019-08-10 ENCOUNTER — Other Ambulatory Visit: Payer: Self-pay

## 2019-08-10 ENCOUNTER — Encounter: Payer: Self-pay | Admitting: Endocrinology

## 2019-08-10 ENCOUNTER — Ambulatory Visit: Payer: Managed Care, Other (non HMO) | Admitting: Endocrinology

## 2019-08-10 VITALS — BP 112/64 | HR 95 | Ht 61.0 in | Wt 143.4 lb

## 2019-08-10 DIAGNOSIS — E0590 Thyrotoxicosis, unspecified without thyrotoxic crisis or storm: Secondary | ICD-10-CM | POA: Diagnosis not present

## 2019-08-10 LAB — MICROALBUMIN / CREATININE URINE RATIO: Microalb, Ur: 0.7

## 2019-08-10 LAB — TSH: TSH: <0.01 u[IU]/mL — ABNORMAL LOW (ref 0.35–4.50)

## 2019-08-10 LAB — T4, FREE: Free T4: 0.75 ng/dL (ref 0.60–1.60)

## 2019-08-10 MED ORDER — METOPROLOL TARTRATE 25 MG PO TABS: 12.5000 mg | ORAL_TABLET | Freq: Two times a day (BID) | ORAL | 3 refills | Status: DC

## 2019-08-10 MED ORDER — SYNJARDY 5-1000 MG PO TABS: 1.0000 | ORAL_TABLET | Freq: Two times a day (BID) | ORAL | 11 refills | Status: DC

## 2019-08-10 NOTE — Progress Notes (Signed)
Subjective:    Patient ID: Janet Kelley, female    DOB: 07/22/1960, 59 y.o.   MRN: [id number]  HPI Pt returns for f/u of hyperthyroidism (dx'ed 2021; she had a thyroid nodule bx in approx 1995; result is not available, but she says this was benign.  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  Pt returns for f/u of diabetes mellitus: DM type: 2 Dx'ed: 123XX123 Complications: none Therapy: Synjardy GDM: never DKA: never Severe hypoglycemia: never Pancreatitis: never Pancreatic imaging: normal on 2021 CT SDOH: none Other: she has never been on insulin Interval history: She says she is on Synjardy, not Janumet.  pt states she feels well in general. Past Medical History:  Diagnosis Date  . Anxiety   . Cervical dysplasia   . Diabetes mellitus    Type 2  . Elevated cholesterol   . Endometriosis   . Fibroid   . Vitamin D deficiency December 2014    Past Surgical History:  Procedure Laterality Date  . CERVICAL DISC SURGERY    . COLPOSCOPY    . GYNECOLOGIC CRYOSURGERY    . KNEE SURGERY     Arthroscopic  . PELVIC LAPAROSCOPY     DL laser endometriosis  . VAGINAL HYSTERECTOMY  2009   [facility]    Social History   Socioeconomic History  . Marital status: Single    Spouse name: Not on file  . Number of children: Not on file  . Years of education: Not on file  . Highest education level: Not on file  Occupational History  . Not on file  Tobacco Use  . Smoking status: Former Smoker    Types: Cigarettes    Quit date: 03/16/2006    Years since quitting: 13.4  . Smokeless tobacco: Never Used  Substance and Sexual Activity  . Alcohol use: Yes    Alcohol/week: 2.0 standard drinks    Types: 2 Standard drinks or equivalent per week    Comment: occasiaonal  . Drug use: No  . Sexual activity: Yes    Partners: Male    Birth control/protection: Surgical    Comment: 1st  intercourse- 18, partners- greater than 5  Other Topics Concern  . Not on file  Social History Narrative  . Not on file   Social Determinants of Health   Financial Resource Strain:   . Difficulty of Paying Living Expenses: Not on file  Food Insecurity:   . Worried About Charity fundraiser in the Last Year: Not on file  . Ran Out of Food in the Last Year: Not on file  Transportation Needs:   . Lack of Transportation (Medical): Not on file  . Lack of Transportation (Non-Medical): Not on file  Physical Activity:   . Days of Exercise per Week: Not on file  . Minutes of Exercise per Session: Not on file  Stress:   . Feeling of Stress : Not on file  Social Connections:   . Frequency of Communication with Friends and Family: Not on file  . Frequency of Social Gatherings with Friends and Family: Not on file  . Attends Religious Services: Not on file  . Active Member of Clubs or Organizations: Not on file  . Attends Archivist Meetings: Not on file  . Marital Status: Not on file  Intimate Partner Violence:   . Fear of Current or Ex-Partner: Not on  file  . Emotionally Abused: Not on file  . Physically Abused: Not on file  . Sexually Abused: Not on file    Current Outpatient Medications on File Prior to Visit  Medication Sig Dispense Refill  . amphetamine-dextroamphetamine (ADDERALL XR) 10 MG 24 hr capsule Take 10 mg by mouth 2 (two) times daily. Take at 8:00 and 12 noon    . aspirin 81 MG tablet Take 81 mg by mouth daily.    . busPIRone (BUSPAR) 5 MG tablet Take 1 tablet by mouth 2 (two) times daily.    . citalopram (CELEXA) 40 MG tablet Take 40 mg by mouth daily.     . clonazePAM (KLONOPIN) 0.5 MG tablet Take 0.5 mg by mouth at bedtime.     Marland Kitchen esomeprazole (NEXIUM) 20 MG capsule Take 20 mg by mouth daily at 12 noon.    . Estradiol 10 MCG TABS vaginal tablet Place 1 tablet (10 mcg total) vaginally 2 (two) times a week. (Patient taking differently: Place 1 tablet vaginally 2  (two) times a week. Tuesday and Friday) 24 tablet 4  . ezetimibe (ZETIA) 10 MG tablet Take 10 mg by mouth at bedtime.     . fexofenadine (ALLEGRA) 180 MG tablet Take 180 mg by mouth daily.    . methimazole (TAPAZOLE) 10 MG tablet Take 1 tablet (10 mg total) by mouth 2 (two) times daily. 60 tablet 1  . promethazine (PHENERGAN) 25 MG tablet Take 25 mg by mouth daily as needed.    . rosuvastatin (CRESTOR) 40 MG tablet Take 40 mg by mouth at bedtime.     . Vitamin D, Ergocalciferol, (DRISDOL) 50000 UNITS CAPS capsule Take 1 capsule (50,000 Units total) by mouth every 7 (seven) days. 12 capsule 0   No current facility-administered medications on file prior to visit.    Allergies  Allergen Reactions  . Ceclor [Cefaclor] Rash    Family History  Problem Relation Age of Onset  . Hypertension Mother   . Heart disease Mother   . Diabetes Mother   . Cancer Mother        throat cancer  . Hyperthyroidism Father   . Hypertension Sister   . Heart disease Paternal Uncle   . Colon cancer Paternal Grandmother   . Colon cancer Cousin        STOMACH    BP 112/64 (BP Location: Right Arm, Patient Position: Sitting, Cuff Size: Normal)   Pulse 95   Ht 5\' 1"  (1.549 m)   Wt 143 lb 6.4 oz (65 kg)   SpO2 98%   BMI 27.10 kg/m    Review of Systems Denies fever    Objective:   Physical Exam VITAL SIGNS:  See vs page GENERAL: no distress NECK: There is no palpable thyroid enlargement.  No thyroid nodule is palpable.  No palpable lymphadenopathy at the anterior neck. Pulses: dorsalis pedis intact bilat.   MSK: no deformity of the feet CV: no leg edema Skin:  no ulcer on the feet.  normal color and temp on the feet. Neuro: sensation is intact to touch on the feet  Lab Results  Component Value Date   CREATININE 0.5 07/12/2019   BUN 6 07/12/2019   NA 139 06/11/2019   K 3.2 (L) 06/11/2019   CL 100 06/11/2019   CO2 23 06/11/2019   Lab Results  Component Value Date   HGBA1C 6.1 07/12/2019        Assessment & Plan:  Type 2 DM, new to me: well-controlled  Hyperthyroidism: much better: reduce tapazole to 10/d.    Patient Instructions  Thyroid blood tests are requested for you today.  We'll let you know about the results.  If ever you have fever while taking methimazole, stop it and call us, even if the reason is obvious, because of the risk of a rare side-effect.  It is best to never miss the methimazole.  However, if you do miss it, next best is to double up the next time.   Please reduce the metoprolol to 1/2 pill, twice a day.   Please continue the same Synjardy.   Please come back for a follow-up appointment in 2 months.

## 2019-08-10 NOTE — Patient Instructions (Addendum)
Thyroid blood tests are requested for you today.  We'll let you know about the results.  If ever you have fever while taking methimazole, stop it and call us, even if the reason is obvious, because of the risk of a rare side-effect.  It is best to never miss the methimazole.  However, if you do miss it, next best is to double up the next time.   Please reduce the metoprolol to 1/2 pill, twice a day.   Please continue the same Synjardy.   Please come back for a follow-up appointment in 2 months.

## 2019-08-19 ENCOUNTER — Other Ambulatory Visit: Payer: Self-pay | Admitting: Endocrinology

## 2019-10-12 ENCOUNTER — Other Ambulatory Visit: Payer: Self-pay

## 2019-10-12 ENCOUNTER — Ambulatory Visit: Payer: Managed Care, Other (non HMO) | Admitting: Endocrinology

## 2019-10-12 ENCOUNTER — Encounter: Payer: Self-pay | Admitting: Endocrinology

## 2019-10-12 VITALS — BP 114/70 | HR 85 | Ht 61.0 in | Wt 145.0 lb

## 2019-10-12 DIAGNOSIS — E119 Type 2 diabetes mellitus without complications: Secondary | ICD-10-CM

## 2019-10-12 DIAGNOSIS — E0590 Thyrotoxicosis, unspecified without thyrotoxic crisis or storm: Secondary | ICD-10-CM | POA: Diagnosis not present

## 2019-10-12 LAB — TSH: TSH: <0.01 u[IU]/mL — ABNORMAL LOW (ref 0.35–4.50)

## 2019-10-12 LAB — T4, FREE: Free T4: 1.55 ng/dL (ref 0.60–1.60)

## 2019-10-12 LAB — POCT GLYCOSYLATED HEMOGLOBIN (HGB A1C): Hemoglobin A1C: 6.7 % — AB (ref 4.0–5.6)

## 2019-10-12 MED ORDER — SYNJARDY 5-1000 MG PO TABS: 1.0000 | ORAL_TABLET | Freq: Every day | ORAL | 11 refills | Status: DC

## 2019-10-12 MED ORDER — METHIMAZOLE 10 MG PO TABS: 20.0000 mg | ORAL_TABLET | Freq: Two times a day (BID) | ORAL | 3 refills | Status: DC

## 2019-10-12 MED ORDER — FLUCONAZOLE 150 MG PO TABS: 150.0000 mg | ORAL_TABLET | Freq: Every day | ORAL | 2 refills | Status: DC

## 2019-10-12 MED ORDER — JANUMET XR 100-1000 MG PO TB24: 1.0000 | ORAL_TABLET | Freq: Every day | ORAL | 11 refills | Status: DC

## 2019-10-12 NOTE — Progress Notes (Signed)
Subjective:    Patient ID: Janet Kelley, female    DOB: March 22, 1961, 59 y.o.   MRN: [id number]  HPI Pt returns for f/u of hyperthyroidism (dx'ed 2021; she had a thyroid nodule bx in approx 1995; result is not available, but she says this was benign.  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  Pt returns for f/u of diabetes mellitus:  DM type: 2 Dx'ed: 123XX123 Complications: none Therapy: Synjardy GDM: never DKA: never Severe hypoglycemia: never Pancreatitis: never Pancreatic imaging: normal on 2021 CT SDOH: none Other: she has never been on insulin; she did not tolerate Trulicity, Byetta, or Victoza (nausea).   Interval history: She says she takes meds as rx'ed.  pt reports vag itching.  Past Medical History:  Diagnosis Date  . Anxiety   . Cervical dysplasia   . Diabetes mellitus    Type 2  . Elevated cholesterol   . Endometriosis   . Fibroid   . Vitamin D deficiency December 2014    Past Surgical History:  Procedure Laterality Date  . CERVICAL DISC SURGERY    . COLPOSCOPY    . GYNECOLOGIC CRYOSURGERY    . KNEE SURGERY     Arthroscopic  . PELVIC LAPAROSCOPY     DL laser endometriosis  . VAGINAL HYSTERECTOMY  2009   [facility]    Social History   Socioeconomic History  . Marital status: Single    Spouse name: Not on file  . Number of children: Not on file  . Years of education: Not on file  . Highest education level: Not on file  Occupational History  . Not on file  Tobacco Use  . Smoking status: Former Smoker    Types: Cigarettes    Quit date: 03/16/2006    Years since quitting: 13.5  . Smokeless tobacco: Never Used  Substance and Sexual Activity  . Alcohol use: Yes    Alcohol/week: 2.0 standard drinks    Types: 2 Standard drinks or equivalent per week    Comment: occasiaonal  . Drug use: No  . Sexual activity: Yes    Partners: Male    Birth  control/protection: Surgical    Comment: 1st intercourse- 18, partners- greater than 5  Other Topics Concern  . Not on file  Social History Narrative  . Not on file   Social Determinants of Health   Financial Resource Strain:   . Difficulty of Paying Living Expenses:   Food Insecurity:   . Worried About Charity fundraiser in the Last Year:   . Arboriculturist in the Last Year:   Transportation Needs:   . Film/video editor (Medical):   Marland Kitchen Lack of Transportation (Non-Medical):   Physical Activity:   . Days of Exercise per Week:   . Minutes of Exercise per Session:   Stress:   . Feeling of Stress :   Social Connections:   . Frequency of Communication with Friends and Family:   . Frequency of Social Gatherings with Friends and Family:   . Attends Religious Services:   . Active Member of Clubs or Organizations:   . Attends Archivist Meetings:   Marland Kitchen Marital Status:   Intimate Partner Violence:   . Fear of Current or Ex-Partner:   . Emotionally Abused:   Marland Kitchen Physically Abused:   . Sexually Abused:     Current Outpatient Medications  on File Prior to Visit  Medication Sig Dispense Refill  . amphetamine-dextroamphetamine (ADDERALL XR) 10 MG 24 hr capsule Take 10 mg by mouth 2 (two) times daily. Take at 8:00 and 12 noon    . aspirin 81 MG tablet Take 81 mg by mouth daily.    . busPIRone (BUSPAR) 5 MG tablet Take 1 tablet by mouth 2 (two) times daily.    . citalopram (CELEXA) 40 MG tablet Take 40 mg by mouth daily.     . clonazePAM (KLONOPIN) 0.5 MG tablet Take 0.5 mg by mouth at bedtime.     Marland Kitchen esomeprazole (NEXIUM) 20 MG capsule Take 20 mg by mouth daily at 12 noon.    . Estradiol 10 MCG TABS vaginal tablet Place 1 tablet (10 mcg total) vaginally 2 (two) times a week. (Patient taking differently: Place 1 tablet vaginally 2 (two) times a week. Tuesday and Friday) 24 tablet 4  . ezetimibe (ZETIA) 10 MG tablet Take 10 mg by mouth at bedtime.     . fexofenadine (ALLEGRA)  180 MG tablet Take 180 mg by mouth daily.    . promethazine (PHENERGAN) 25 MG tablet Take 25 mg by mouth daily as needed.    . rosuvastatin (CRESTOR) 40 MG tablet Take 40 mg by mouth at bedtime.     . Vitamin D, Ergocalciferol, (DRISDOL) 50000 UNITS CAPS capsule Take 1 capsule (50,000 Units total) by mouth every 7 (seven) days. 12 capsule 0   No current facility-administered medications on file prior to visit.    Allergies  Allergen Reactions  . Ceclor [Cefaclor] Rash    Family History  Problem Relation Age of Onset  . Hypertension Mother   . Heart disease Mother   . Diabetes Mother   . Cancer Mother        throat cancer  . Hyperthyroidism Father   . Hypertension Sister   . Heart disease Paternal Uncle   . Colon cancer Paternal Grandmother   . Colon cancer Cousin        STOMACH    BP 114/70   Pulse 85   Ht 5\' 1"  (1.549 m)   Wt 145 lb (65.8 kg)   SpO2 98%   BMI 27.40 kg/m    Review of Systems     Objective:   Physical Exam   Lab Results  Component Value Date   TSH <0.01 (L) 10/12/2019   T3TOTAL 319 (H) 06/12/2019      Assessment & Plan:  Hyperthyroidism: we discussed rx options. she chooses to continue tapazole indefinitely.  increase the methimazole to 20 mg, twice a day Type 2 DM: worse Vaginitis, new: side effect of synjardy  Patient Instructions  Thyroid blood tests are requested for you today.  We'll let you know about the results.  If ever you have fever while taking methimazole, stop it and call us, even if the reason is obvious, because of the risk of a rare side-effect.  It is best to never miss the methimazole.  However, if you do miss it, next best is to double up the next time.   Please stop taking the metoprolol.  Please reduce the Synjardy to 1 pill per day.  I have sent a prescription to your pharmacy, for the yeast infection Please add "Janumet," 1 pill per day Please come back for a follow-up appointment in 2 months.

## 2019-10-12 NOTE — Patient Instructions (Addendum)
Thyroid blood tests are requested for you today.  We'll let you know about the results.  If ever you have fever while taking methimazole, stop it and call us, even if the reason is obvious, because of the risk of a rare side-effect.  It is best to never miss the methimazole.  However, if you do miss it, next best is to double up the next time.   Please stop taking the metoprolol.  Please reduce the Synjardy to 1 pill per day.  I have sent a prescription to your pharmacy, for the yeast infection Please add "Janumet," 1 pill per day Please come back for a follow-up appointment in 2 months.

## 2019-11-07 ENCOUNTER — Other Ambulatory Visit: Payer: Self-pay

## 2019-11-08 ENCOUNTER — Ambulatory Visit (INDEPENDENT_AMBULATORY_CARE_PROVIDER_SITE_OTHER): Payer: Managed Care, Other (non HMO) | Admitting: Obstetrics & Gynecology

## 2019-11-08 ENCOUNTER — Encounter: Payer: Self-pay | Admitting: Obstetrics & Gynecology

## 2019-11-08 VITALS — BP 140/84 | Ht 60.5 in | Wt 157.0 lb

## 2019-11-08 DIAGNOSIS — N3281 Overactive bladder: Secondary | ICD-10-CM | POA: Diagnosis not present

## 2019-11-08 DIAGNOSIS — Z90710 Acquired absence of both cervix and uterus: Secondary | ICD-10-CM

## 2019-11-08 DIAGNOSIS — Z1272 Encounter for screening for malignant neoplasm of vagina: Secondary | ICD-10-CM

## 2019-11-08 DIAGNOSIS — Z6830 Body mass index (BMI) 30.0-30.9, adult: Secondary | ICD-10-CM

## 2019-11-08 DIAGNOSIS — Z780 Asymptomatic menopausal state: Secondary | ICD-10-CM

## 2019-11-08 DIAGNOSIS — E6609 Other obesity due to excess calories: Secondary | ICD-10-CM

## 2019-11-08 DIAGNOSIS — Z01419 Encounter for gynecological examination (general) (routine) without abnormal findings: Secondary | ICD-10-CM | POA: Diagnosis not present

## 2019-11-08 NOTE — Progress Notes (Signed)
Janet Kelley [birth year] [id number]   History:    59 y.o. G0 Single.   RP:  Established patient presenting for annual gyn exam   HPI: S/P Vaginal Hysterectomy.  Menopause, well on no HRT.  No pelvic pain.  Abstinent x last year, had IC since last Pap in 2019.  Urine normal.  BMs normal.  Breasts normal.  BMI 30.16.  Walks a lot at [employer].  DM type 2 on Janumet. Hyperthyroidism on Methimazole.  Health labs with Fam MD.   Past medical history,surgical history, family history and social history were all reviewed and documented in the EPIC chart.  Gynecologic History No LMP recorded. Patient has had a hysterectomy.  Obstetric History OB History  Gravida Para Term Preterm AB Living  0 0 0 0 0 0  SAB TAB Ectopic Multiple Live Births  0 0 0 0 0     ROS: A ROS was performed and pertinent positives and negatives are included in the history.  GENERAL: No fevers or chills. HEENT: No change in vision, no earache, sore throat or sinus congestion. NECK: No pain or stiffness. CARDIOVASCULAR: No chest pain or pressure. No palpitations. PULMONARY: No shortness of breath, cough or wheeze. GASTROINTESTINAL: No abdominal pain, nausea, vomiting or diarrhea, melena or bright red blood per rectum. GENITOURINARY: No urinary frequency, urgency, hesitancy or dysuria. MUSCULOSKELETAL: No joint or muscle pain, no back pain, no recent trauma. DERMATOLOGIC: No rash, no itching, no lesions. ENDOCRINE: No polyuria, polydipsia, no heat or cold intolerance. No recent change in weight. HEMATOLOGICAL: No anemia or easy bruising or bleeding. NEUROLOGIC: No headache, seizures, numbness, tingling or weakness. PSYCHIATRIC: No depression, no loss of interest in normal activity or change in sleep pattern.     Exam:   BP 140/84   Ht 5' 0.5" (1.537 m)   Wt 157 lb (71.2 kg)   BMI 30.16 kg/m   Body mass index is 30.16 kg/m.  General appearance : Well developed well nourished female. No acute  distress HEENT: Eyes: no retinal hemorrhage or exudates,  Neck supple, trachea midline, no carotid bruits, no thyroidmegaly Lungs: Clear to auscultation, no rhonchi or wheezes, or rib retractions  Heart: Regular rate and rhythm, no murmurs or gallops Breast:Examined in sitting and supine position were symmetrical in appearance, no palpable masses or tenderness,  no skin retraction, no nipple inversion, no nipple discharge, no skin discoloration, no axillary or supraclavicular lymphadenopathy Abdomen: no palpable masses or tenderness, no rebound or guarding Extremities: no edema or skin discoloration or tenderness  Pelvic: Vulva: Normal             Vagina: No gross lesions or discharge.  Pap reflex done.  Cervix/Uterus absent  Adnexa  Without masses or tenderness  Anus: Normal   Assessment/Plan:  59 y.o. female for annual exam   1. Encounter for Papanicolaou smear of vagina as part of routine gynecological examination Gynecologic exam status post total hysterectomy and menopause.  Pap reflex done on the vaginal vault.  Breast exam normal.  Screening mammogram December 2020 was negative.  Colonoscopy 2018.  Health labs with family physician.  2. S/P total hysterectomy  3. Postmenopause Well on no hormone replacement therapy.  Bone density completely normal in 2018, recommend repeating a bone density at 5 years.  Vitamin D supplements, calcium intake of 1200 mg daily and regular weightbearing physical activity is recommended.  4. Overactive bladder Will reduce caffeine products.  Observation for now.  Will call back if desires to  start back on a bladder Anti-Spasmodic treatment.   5. Class 1 obesity due to excess calories with serious comorbidity and body mass index (BMI) of 30.0 to 30.9 in adult Recommend a lower calorie/carb diet such as [employer].  Aerobic activities 5 times a week and light weightlifting every 2 days.  Other orders - Pap IG w/ reflex to HPV when  [facility]  Princess Bruins MD, 4:09 PM 11/08/2019

## 2019-11-12 LAB — PAP IG W/ RFLX HPV ASCU

## 2019-11-15 ENCOUNTER — Encounter: Payer: Self-pay | Admitting: Obstetrics & Gynecology

## 2019-11-15 NOTE — Patient Instructions (Addendum)
1. Encounter for Papanicolaou smear of vagina as part of routine gynecological examination Gynecologic exam status post total hysterectomy and menopause.  Pap reflex done on the vaginal vault.  Breast exam normal.  Screening mammogram December 2020 was negative.  Colonoscopy 2018.  Health labs with family physician.  2. S/P total hysterectomy  3. Postmenopause Well on no hormone replacement therapy.  Bone density completely normal in 2018, recommend repeating a bone density at 5 years.  Vitamin D supplements, calcium intake of 1200 mg daily and regular weightbearing physical activity is recommended.  4. Overactive bladder Will reduce caffeine products.  Observation for now.  Will call back if desires to start back on a bladder Anti-Spasmodic treatment.   5. Class 1 obesity due to excess calories with serious comorbidity and body mass index (BMI) of 30.0 to 30.9 in adult Recommend a lower calorie/carb diet such as [employer].  Aerobic activities 5 times a week and light weightlifting every 2 days.  Other orders - Pap IG w/ reflex to HPV when [facility]  Janet Kelley, it was a pleasure seeing you today!  I will inform you of your results as soon as they are available.

## 2019-12-14 ENCOUNTER — Other Ambulatory Visit: Payer: Self-pay

## 2019-12-14 ENCOUNTER — Ambulatory Visit: Payer: Managed Care, Other (non HMO) | Admitting: Endocrinology

## 2019-12-14 ENCOUNTER — Encounter: Payer: Self-pay | Admitting: Endocrinology

## 2019-12-14 VITALS — BP 142/88 | HR 104 | Ht 60.5 in | Wt 155.0 lb

## 2019-12-14 DIAGNOSIS — E119 Type 2 diabetes mellitus without complications: Secondary | ICD-10-CM

## 2019-12-14 DIAGNOSIS — E0590 Thyrotoxicosis, unspecified without thyrotoxic crisis or storm: Secondary | ICD-10-CM

## 2019-12-14 LAB — POCT GLYCOSYLATED HEMOGLOBIN (HGB A1C): Hemoglobin A1C: 6.3 % — AB (ref 4.0–5.6)

## 2019-12-14 LAB — TSH: TSH: 8.64 u[IU]/mL — ABNORMAL HIGH (ref 0.35–4.50)

## 2019-12-14 LAB — T4, FREE: Free T4: 0.49 ng/dL — ABNORMAL LOW (ref 0.60–1.60)

## 2019-12-14 MED ORDER — METHIMAZOLE 10 MG PO TABS: 10.0000 mg | ORAL_TABLET | Freq: Two times a day (BID) | ORAL | 3 refills | Status: DC

## 2019-12-14 NOTE — Progress Notes (Signed)
Subjective:    Patient ID: Janet Kelley, female    DOB: Mar 23, 1961, 60 y.o.   MRN: [id number]  HPI Pt returns for f/u of hyperthyroidism (dx'ed 2021; she had a thyroid nodule bx in approx 1995; result is not available, but she says this was benign.  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  Pt returns for f/u of diabetes mellitus:  DM type: 2 Dx'ed: 7846 Complications: none Therapy: Synjardy GDM: never DKA: never Severe hypoglycemia: never Pancreatitis: never Pancreatic imaging: normal on 2021 CT SDOH: none Other: she has never been on insulin; she did not tolerate Trulicity, Byetta, or Victoza (nausea).   Interval history: She says she takes meds as rx'ed.  pt states she feels well in general.  Past Medical History:  Diagnosis Date  . Anxiety   . Cervical dysplasia   . Diabetes mellitus    Type 2  . Elevated cholesterol   . Endometriosis   . Fibroid   . Hyperthyroidism   . Vitamin D deficiency December 2014    Past Surgical History:  Procedure Laterality Date  . CERVICAL DISC SURGERY    . COLPOSCOPY    . GYNECOLOGIC CRYOSURGERY    . KNEE SURGERY     Arthroscopic  . PELVIC LAPAROSCOPY     DL laser endometriosis  . VAGINAL HYSTERECTOMY  2009   [facility]    Social History   Socioeconomic History  . Marital status: Single    Spouse name: Not on file  . Number of children: Not on file  . Years of education: Not on file  . Highest education level: Not on file  Occupational History  . Not on file  Tobacco Use  . Smoking status: Former Smoker    Types: Cigarettes    Quit date: 03/16/2006    Years since quitting: 13.7  . Smokeless tobacco: Never Used  Vaping Use  . Vaping Use: Never used  Substance and Sexual Activity  . Alcohol use: Yes    Alcohol/week: 2.0 standard drinks    Types: 2 Standard drinks or equivalent per week    Comment: occasiaonal  .  Drug use: No  . Sexual activity: Yes    Partners: Male    Birth control/protection: Surgical    Comment: 1st intercourse- 18, partners- greater than 5  Other Topics Concern  . Not on file  Social History Narrative  . Not on file   Social Determinants of Health   Financial Resource Strain:   . Difficulty of Paying Living Expenses:   Food Insecurity:   . Worried About Charity fundraiser in the Last Year:   . Arboriculturist in the Last Year:   Transportation Needs:   . Film/video editor (Medical):   Marland Kitchen Lack of Transportation (Non-Medical):   Physical Activity:   . Days of Exercise per Week:   . Minutes of Exercise per Session:   Stress:   . Feeling of Stress :   Social Connections:   . Frequency of Communication with Friends and Family:   . Frequency of Social Gatherings with Friends and Family:   . Attends Religious Services:   . Active Member of Clubs or Organizations:   . Attends Archivist Meetings:   Marland Kitchen Marital Status:   Intimate Partner Violence:   . Fear of Current or Ex-Partner:   . Emotionally Abused:   .  Physically Abused:   . Sexually Abused:     Current Outpatient Medications on File Prior to Visit  Medication Sig Dispense Refill  . amphetamine-dextroamphetamine (ADDERALL XR) 10 MG 24 hr capsule Take 10 mg by mouth 2 (two) times daily. Take at 8:00 and 12 noon    . aspirin 81 MG tablet Take 81 mg by mouth daily.    . busPIRone (BUSPAR) 5 MG tablet Take 1 tablet by mouth 2 (two) times daily.    . citalopram (CELEXA) 40 MG tablet Take 40 mg by mouth daily.     . clonazePAM (KLONOPIN) 0.5 MG tablet Take 0.5 mg by mouth at bedtime.     . Empagliflozin-metFORMIN HCl (SYNJARDY) 10-998 MG TABS Take 1 tablet by mouth daily. 30 tablet 11  . esomeprazole (NEXIUM) 20 MG capsule Take 20 mg by mouth daily at 12 noon.    . Estradiol 10 MCG TABS vaginal tablet Place 1 tablet (10 mcg total) vaginally 2 (two) times a week. (Patient taking differently: Place 1  tablet vaginally 2 (two) times a week. Tuesday and Friday) 24 tablet 4  . ezetimibe (ZETIA) 10 MG tablet Take 10 mg by mouth at bedtime.     . fexofenadine (ALLEGRA) 180 MG tablet Take 180 mg by mouth daily.    . fluconazole (DIFLUCAN) 150 MG tablet Take 1 tablet (150 mg total) by mouth daily. 3 tablet 2  . promethazine (PHENERGAN) 25 MG tablet Take 25 mg by mouth daily as needed.    . rosuvastatin (CRESTOR) 40 MG tablet Take 40 mg by mouth at bedtime.     . SitaGLIPtin-MetFORMIN HCl (JANUMET XR) [phone] MG TB24 Take 1 tablet by mouth daily. 30 tablet 11   No current facility-administered medications on file prior to visit.    Allergies  Allergen Reactions  . Ceclor [Cefaclor] Rash    Family History  Problem Relation Age of Onset  . Hypertension Mother   . Heart disease Mother   . Diabetes Mother   . Cancer Mother        throat cancer  . Hyperthyroidism Father   . Hypertension Sister   . Heart disease Paternal Uncle   . Colon cancer Paternal Grandmother   . Colon cancer Cousin        STOMACH    BP (!) 142/88   Pulse (!) 104   Ht 5' 0.5" (1.537 m)   Wt 155 lb (70.3 kg)   SpO2 98%   BMI 29.77 kg/m    Review of Systems Denies fever    Objective:   Physical Exam VITAL SIGNS:  See vs page GENERAL: no distress Pulses: dorsalis pedis intact bilat.   MSK: no deformity of the feet CV: no leg edema Skin:  no ulcer on the feet.  normal color and temp on the feet. Neuro: sensation is intact to touch on the feet  Lab Results  Component Value Date   HGBA1C 6.3 (A) 12/14/2019   Lab Results  Component Value Date   TSH 8.64 (H) 12/14/2019   T3TOTAL 319 (H) 06/12/2019       Assessment & Plan:  HTN: is noted today Type 2 DM: well-controlled Hyperthyroidism: overcontrolled.  Reduce tapazole  Patient Instructions  Your blood pressure is high today.  Please see your primary care provider soon, to have it rechecked Thyroid blood tests are requested for you today.   We'll let you know about the results.  If ever you have fever while taking methimazole, stop it and  call us, even if the reason is obvious, because of the risk of a rare side-effect.  It is best to never miss the methimazole.  However, if you do miss it, next best is to double up the next time.   Please come back for a follow-up appointment in 2 months.

## 2019-12-14 NOTE — Patient Instructions (Addendum)
Your blood pressure is high today.  Please see your primary care provider soon, to have it rechecked Thyroid blood tests are requested for you today.  We'll let you know about the results.  If ever you have fever while taking methimazole, stop it and call us, even if the reason is obvious, because of the risk of a rare side-effect.  It is best to never miss the methimazole.  However, if you do miss it, next best is to double up the next time.   Please come back for a follow-up appointment in 2 months.

## 2020-02-19 ENCOUNTER — Encounter: Payer: Self-pay | Admitting: Endocrinology

## 2020-02-19 ENCOUNTER — Other Ambulatory Visit: Payer: Self-pay

## 2020-02-19 ENCOUNTER — Ambulatory Visit: Payer: Managed Care, Other (non HMO) | Admitting: Endocrinology

## 2020-02-19 VITALS — BP 128/78 | HR 97 | Ht 60.5 in | Wt 154.4 lb

## 2020-02-19 DIAGNOSIS — E0590 Thyrotoxicosis, unspecified without thyrotoxic crisis or storm: Secondary | ICD-10-CM | POA: Diagnosis not present

## 2020-02-19 DIAGNOSIS — E119 Type 2 diabetes mellitus without complications: Secondary | ICD-10-CM | POA: Diagnosis not present

## 2020-02-19 LAB — T4, FREE: Free T4: 0.37 ng/dL — ABNORMAL LOW (ref 0.60–1.60)

## 2020-02-19 LAB — POCT GLYCOSYLATED HEMOGLOBIN (HGB A1C): Hemoglobin A1C: 6.3 % — AB (ref 4.0–5.6)

## 2020-02-19 LAB — TSH: TSH: 18.81 u[IU]/mL — ABNORMAL HIGH (ref 0.35–4.50)

## 2020-02-19 MED ORDER — METHIMAZOLE 10 MG PO TABS: 10.0000 mg | ORAL_TABLET | Freq: Every day | ORAL | 0 refills | Status: DC

## 2020-02-19 NOTE — Progress Notes (Signed)
Subjective:    Patient ID: Janet Kelley, female    DOB: November 08, 1960, 59 y.o.   MRN: [id number]  HPI Pt returns for f/u of hyperthyroidism (dx'ed 2021; she had a thyroid nodule bx in approx 1995; result is not available, but she says this was benign.  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  Pt says she misread the last result note, and still takes tapazole at 2x10 mg, BID. Pt returns for f/u of diabetes mellitus:  DM type: 2 Dx'ed: 7048 Complications: none Therapy: Synjardy GDM: never DKA: never Severe hypoglycemia: never Pancreatitis: never Pancreatic imaging: normal on 2021 CT SDOH: none Other: she has never been on insulin; she did not tolerate Trulicity, Byetta, or Victoza (nausea).   Interval history: She says she takes meds as rx'ed.  pt states she feels well in general.   Past Medical History:  Diagnosis Date  . Anxiety   . Cervical dysplasia   . Diabetes mellitus    Type 2  . Elevated cholesterol   . Endometriosis   . Fibroid   . Hyperthyroidism   . Vitamin D deficiency December 2014    Past Surgical History:  Procedure Laterality Date  . CERVICAL DISC SURGERY    . COLPOSCOPY    . GYNECOLOGIC CRYOSURGERY    . KNEE SURGERY     Arthroscopic  . PELVIC LAPAROSCOPY     DL laser endometriosis  . VAGINAL HYSTERECTOMY  2009   [facility]    Social History   Socioeconomic History  . Marital status: Single    Spouse name: Not on file  . Number of children: Not on file  . Years of education: Not on file  . Highest education level: Not on file  Occupational History  . Not on file  Tobacco Use  . Smoking status: Former Smoker    Types: Cigarettes    Quit date: 03/16/2006    Years since quitting: 13.9  . Smokeless tobacco: Never Used  Vaping Use  . Vaping Use: Never used  Substance and Sexual Activity  . Alcohol use: Yes    Alcohol/week: 2.0 standard  drinks    Types: 2 Standard drinks or equivalent per week    Comment: occasiaonal  . Drug use: No  . Sexual activity: Yes    Partners: Male    Birth control/protection: Surgical    Comment: 1st intercourse- 18, partners- greater than 5  Other Topics Concern  . Not on file  Social History Narrative  . Not on file   Social Determinants of Health   Financial Resource Strain:   . Difficulty of Paying Living Expenses: Not on file  Food Insecurity:   . Worried About Charity fundraiser in the Last Year: Not on file  . Ran Out of Food in the Last Year: Not on file  Transportation Needs:   . Lack of Transportation (Medical): Not on file  . Lack of Transportation (Non-Medical): Not on file  Physical Activity:   . Days of Exercise per Week: Not on file  . Minutes of Exercise per Session: Not on file  Stress:   . Feeling of Stress : Not on file  Social Connections:   . Frequency of Communication with Friends and Family: Not on file  . Frequency of Social Gatherings with Friends and Family: Not on file  . Attends Religious Services: Not on file  .  Active Member of Clubs or Organizations: Not on file  . Attends Archivist Meetings: Not on file  . Marital Status: Not on file  Intimate Partner Violence:   . Fear of Current or Ex-Partner: Not on file  . Emotionally Abused: Not on file  . Physically Abused: Not on file  . Sexually Abused: Not on file    Current Outpatient Medications on File Prior to Visit  Medication Sig Dispense Refill  . amphetamine-dextroamphetamine (ADDERALL XR) 10 MG 24 hr capsule Take 10 mg by mouth 2 (two) times daily. Take at 8:00 and 12 noon    . aspirin 81 MG tablet Take 81 mg by mouth daily.    . busPIRone (BUSPAR) 5 MG tablet Take 1 tablet by mouth 2 (two) times daily.    . citalopram (CELEXA) 40 MG tablet Take 40 mg by mouth daily.     . clonazePAM (KLONOPIN) 0.5 MG tablet Take 0.5 mg by mouth at bedtime.     . Empagliflozin-metFORMIN HCl  (SYNJARDY) 10-998 MG TABS Take 1 tablet by mouth daily. 30 tablet 11  . esomeprazole (NEXIUM) 20 MG capsule Take 20 mg by mouth daily at 12 noon.    . Estradiol 10 MCG TABS vaginal tablet Place 1 tablet (10 mcg total) vaginally 2 (two) times a week. (Patient taking differently: Place 1 tablet vaginally 2 (two) times a week. Tuesday and Friday) 24 tablet 4  . ezetimibe (ZETIA) 10 MG tablet Take 10 mg by mouth at bedtime.     . fexofenadine (ALLEGRA) 180 MG tablet Take 180 mg by mouth daily.    . fluconazole (DIFLUCAN) 150 MG tablet Take 1 tablet (150 mg total) by mouth daily. 3 tablet 2  . promethazine (PHENERGAN) 25 MG tablet Take 25 mg by mouth daily as needed.    . rosuvastatin (CRESTOR) 40 MG tablet Take 40 mg by mouth at bedtime.     . SitaGLIPtin-MetFORMIN HCl (JANUMET XR) [phone] MG TB24 Take 1 tablet by mouth daily. 30 tablet 11   No current facility-administered medications on file prior to visit.    Allergies  Allergen Reactions  . Ceclor [Cefaclor] Rash    Family History  Problem Relation Age of Onset  . Hypertension Mother   . Heart disease Mother   . Diabetes Mother   . Cancer Mother        throat cancer  . Hyperthyroidism Father   . Hypertension Sister   . Heart disease Paternal Uncle   . Colon cancer Paternal Grandmother   . Colon cancer Cousin        STOMACH    BP 128/78   Pulse 97   Ht 5' 0.5" (1.537 m)   Wt 154 lb 6.4 oz (70 kg)   SpO2 92%   BMI 29.66 kg/m    Review of Systems Denies fever    Objective:   Physical Exam VITAL SIGNS:  See vs page GENERAL: no distress Pulses: dorsalis pedis intact bilat.   MSK: no deformity of the feet CV: no leg edema Skin:  no ulcer on the feet.  normal color and temp on the feet. Neuro: sensation is intact to touch on the feet  Lab Results  Component Value Date   HGBA1C 6.3 (A) 02/19/2020        Assessment & Plan:  Type 2 DM: well-controlled.  Please continue the same Synjardy Hyperthyroidism: recheck  today  Patient Instructions  Thyroid blood tests are requested for you today.  We'll let  you know about the results.  If ever you have fever while taking methimazole, stop it and call us, even if the reason is obvious, because of the risk of a rare side-effect.  It is best to never miss the methimazole.  However, if you do miss it, next best is to double up the next time.   Please come back for a follow-up appointment in 2 months.

## 2020-02-19 NOTE — Patient Instructions (Addendum)
Thyroid blood tests are requested for you today.  We'll let you know about the results.  If ever you have fever while taking methimazole, stop it and call us, even if the reason is obvious, because of the risk of a rare side-effect.  It is best to never miss the methimazole.  However, if you do miss it, next best is to double up the next time.   Please come back for a follow-up appointment in 2 months.

## 2020-03-18 ENCOUNTER — Other Ambulatory Visit: Payer: Self-pay

## 2020-03-18 ENCOUNTER — Ambulatory Visit: Payer: Managed Care, Other (non HMO) | Admitting: Obstetrics & Gynecology

## 2020-03-18 ENCOUNTER — Encounter: Payer: Self-pay | Admitting: Obstetrics & Gynecology

## 2020-03-18 ENCOUNTER — Telehealth: Payer: Self-pay | Admitting: *Deleted

## 2020-03-18 VITALS — BP 120/84 | Wt 152.0 lb

## 2020-03-18 DIAGNOSIS — N6312 Unspecified lump in the right breast, upper inner quadrant: Secondary | ICD-10-CM | POA: Diagnosis not present

## 2020-03-18 NOTE — Telephone Encounter (Signed)
Patient scheduled on 04/08/20 @ 7:45am at [facility] for the below imaging, order faxed.

## 2020-03-18 NOTE — Progress Notes (Signed)
    Janet Kelley Oct 11, 1960 [id number]        59 y.o.  G0 Single  RP: Feels a lump at the Rt upper breast x 5 days.  HPI: Feels a lump at the Rt upper breast x 5 days.  No change in the lump during the 5 days.  Non-tender.  No nipple d/c.  No breast skin change.  Breast exam Neg 11/2019.  Screening mammo negative 05/2019.  No Fam h/o Breast Ca.   OB History  Gravida Para Term Preterm AB Living  0 0 0 0 0 0  SAB TAB Ectopic Multiple Live Births  0 0 0 0 0    Past medical history,surgical history, problem list, medications, allergies, family history and social history were all reviewed and documented in the EPIC chart.   Directed ROS with pertinent positives and negatives documented in the history of present illness/assessment and plan.  Exam:  Vitals:   03/18/20 1507  BP: 120/84  Weight: 152 lb (68.9 kg)   General appearance:  Normal  Breast exam:  Lt breast normal.  Lt axilla Negative.                         Rt breast:  2 x 1 cm ovale NT mobile nodule at 1 O'Clock at the very periphery. No LN felt at the [place].   Assessment/Plan:  59 y.o. G0P0000   1. Breast lump on right side at 1 o'clock position  2 x 1 cm ovale NT mobile nodule at 1 O'Clock at the very periphery x 5 days.  No fam h/o Breast Ca.  Postmenopause on no HRT.  Findings and management discussed with patient.  Schedule a Rt Dx mammo/Breast US at [facility].   Princess Bruins MD, 3:27 PM 03/18/2020

## 2020-03-18 NOTE — Telephone Encounter (Signed)
-----   Message from Princess Bruins, MD sent at 03/18/2020  3:24 PM EDT ----- Regarding: Rt Dx mammo/Breast US at [facility] 2 x 1 cm ovale NT mobile nodule at 1 O'Clock at the very periphery x 5 days.   Schedule a Rt Dx mammo/Breast US at [facility].

## 2020-04-08 ENCOUNTER — Encounter: Payer: Self-pay | Admitting: Obstetrics & Gynecology

## 2020-04-14 ENCOUNTER — Other Ambulatory Visit: Payer: Self-pay

## 2020-04-15 ENCOUNTER — Encounter: Payer: Self-pay | Admitting: Endocrinology

## 2020-04-15 ENCOUNTER — Ambulatory Visit (INDEPENDENT_AMBULATORY_CARE_PROVIDER_SITE_OTHER): Payer: Managed Care, Other (non HMO) | Admitting: Endocrinology

## 2020-04-15 VITALS — BP 130/80 | HR 99 | Ht 60.0 in | Wt 150.8 lb

## 2020-04-15 DIAGNOSIS — E119 Type 2 diabetes mellitus without complications: Secondary | ICD-10-CM

## 2020-04-15 DIAGNOSIS — E0590 Thyrotoxicosis, unspecified without thyrotoxic crisis or storm: Secondary | ICD-10-CM | POA: Diagnosis not present

## 2020-04-15 LAB — POCT GLYCOSYLATED HEMOGLOBIN (HGB A1C): Hemoglobin A1C: 6.6 % — AB (ref 4.0–5.6)

## 2020-04-15 LAB — TSH: TSH: 0.57 u[IU]/mL (ref 0.35–4.50)

## 2020-04-15 LAB — T4, FREE: Free T4: 0.94 ng/dL (ref 0.60–1.60)

## 2020-04-15 NOTE — Patient Instructions (Addendum)
Thyroid blood tests are requested for you today.  We'll let you know about the results.  Please continue the same Synjardy. If ever you have fever while taking methimazole, stop it and call us, even if the reason is obvious, because of the risk of a rare side-effect.  It is best to never miss the methimazole.  However, if you do miss it, next best is to double up the next time.    Please come back for a follow-up appointment in 2 months.

## 2020-04-15 NOTE — Progress Notes (Signed)
Subjective:    Patient ID: Janet Kelley, female    DOB: [birth year], 59 y.o.   MRN: [id number]  HPI Pt returns for f/u of hyperthyroidism (dx'ed 2021; she had a thyroid nodule bx in approx 1995; result is not available, but she says this was benign.  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  Pt says she misread the last result note, and still takes tapazole at10 mg, QD. Pt also has diabetes mellitus:  DM type: 2 Dx'ed: 6629 Complications: none Therapy: Synjardy GDM: never DKA: never Severe hypoglycemia: never Pancreatitis: never Pancreatic imaging: normal on 2021 CT SDOH: none Other: she has never been on insulin; she did not tolerate Trulicity, Byetta, or Victoza (nausea).   Interval history: She says she takes meds as rx'ed.  pt states she feels well in general.   Past Medical History:  Diagnosis Date  . Anxiety   . Cervical dysplasia   . Diabetes mellitus    Type 2  . Elevated cholesterol   . Endometriosis   . Fibroid   . Hyperthyroidism   . Vitamin D deficiency December 2014    Past Surgical History:  Procedure Laterality Date  . CERVICAL DISC SURGERY    . COLPOSCOPY    . GYNECOLOGIC CRYOSURGERY    . KNEE SURGERY     Arthroscopic  . PELVIC LAPAROSCOPY     DL laser endometriosis  . VAGINAL HYSTERECTOMY  2009   [facility]    Social History   Socioeconomic History  . Marital status: Single    Spouse name: Not on file  . Number of children: Not on file  . Years of education: Not on file  . Highest education level: Not on file  Occupational History  . Not on file  Tobacco Use  . Smoking status: Former Smoker    Types: Cigarettes    Quit date: 03/16/2006    Years since quitting: 14.1  . Smokeless tobacco: Never Used  Vaping Use  . Vaping Use: Never used  Substance and Sexual Activity  . Alcohol use: Yes    Alcohol/week: 2.0 standard drinks     Types: 2 Standard drinks or equivalent per week    Comment: occasiaonal  . Drug use: No  . Sexual activity: Yes    Partners: Male    Birth control/protection: Surgical    Comment: 1st intercourse- 18, partners- greater than 5  Other Topics Concern  . Not on file  Social History Narrative  . Not on file   Social Determinants of Health   Financial Resource Strain:   . Difficulty of Paying Living Expenses: Not on file  Food Insecurity:   . Worried About Charity fundraiser in the Last Year: Not on file  . Ran Out of Food in the Last Year: Not on file  Transportation Needs:   . Lack of Transportation (Medical): Not on file  . Lack of Transportation (Non-Medical): Not on file  Physical Activity:   . Days of Exercise per Week: Not on file  . Minutes of Exercise per Session: Not on file  Stress:   . Feeling of Stress : Not on file  Social Connections:   . Frequency of Communication with Friends and Family: Not on file  . Frequency of Social Gatherings with Friends and Family: Not on file  . Attends Religious Services: Not on file  . Active Member  of Clubs or Organizations: Not on file  . Attends Archivist Meetings: Not on file  . Marital Status: Not on file  Intimate Partner Violence:   . Fear of Current or Ex-Partner: Not on file  . Emotionally Abused: Not on file  . Physically Abused: Not on file  . Sexually Abused: Not on file    Current Outpatient Medications on File Prior to Visit  Medication Sig Dispense Refill  . aspirin 81 MG tablet Take 81 mg by mouth daily.    . busPIRone (BUSPAR) 5 MG tablet Take 10 mg by mouth 2 (two) times daily.     . citalopram (CELEXA) 40 MG tablet Take 40 mg by mouth daily.     . clonazePAM (KLONOPIN) 0.5 MG tablet Take 0.5 mg by mouth at bedtime.     Marland Kitchen dexmethylphenidate (FOCALIN) 5 MG tablet Take 10 mg by mouth 2 (two) times daily.    . Empagliflozin-metFORMIN HCl (SYNJARDY) 10-998 MG TABS Take 1 tablet by mouth daily. 30 tablet  11  . esomeprazole (NEXIUM) 20 MG capsule Take 20 mg by mouth daily at 12 noon.    . Estradiol 10 MCG TABS vaginal tablet Place 1 tablet (10 mcg total) vaginally 2 (two) times a week. 24 tablet 4  . ezetimibe (ZETIA) 10 MG tablet Take 10 mg by mouth at bedtime.     . fexofenadine (ALLEGRA) 180 MG tablet Take 180 mg by mouth daily.    . methimazole (TAPAZOLE) 10 MG tablet Take 1 tablet (10 mg total) by mouth daily. 90 tablet 0  . rosuvastatin (CRESTOR) 40 MG tablet Take 40 mg by mouth at bedtime.     . SitaGLIPtin-MetFORMIN HCl (JANUMET XR) [phone] MG TB24 Take 1 tablet by mouth daily. 30 tablet 11   No current facility-administered medications on file prior to visit.    Allergies  Allergen Reactions  . Ceclor [Cefaclor] Rash    Family History  Problem Relation Age of Onset  . Hypertension Mother   . Heart disease Mother   . Diabetes Mother   . Cancer Mother        throat cancer  . Hyperthyroidism Father   . Hypertension Sister   . Heart disease Paternal Uncle   . Colon cancer Paternal Grandmother   . Colon cancer Cousin        STOMACH    BP 130/80   Pulse 99   Ht 5' (1.524 m)   Wt 150 lb 12.8 oz (68.4 kg)   SpO2 99%   BMI 29.45 kg/m    Review of Systems Denies fever    Objective:   Physical Exam VITAL SIGNS:  See vs page GENERAL: no distress NECK: There is no palpable thyroid enlargement.  No thyroid nodule is palpable.  No palpable lymphadenopathy at the anterior neck.  Lab Results  Component Value Date   TSH 0.57 04/15/2020   T3TOTAL 319 (H) 06/12/2019   Lab Results  Component Value Date   HGBA1C 6.6 (A) 04/15/2020       Assessment & Plan:  Hyperthyroidism: well-controlled.  Please continue the same methimazole. Type 2 DM: well-controlled  Patient Instructions  Thyroid blood tests are requested for you today.  We'll let you know about the results.  Please continue the same Synjardy. If ever you have fever while taking methimazole, stop it and  call us, even if the reason is obvious, because of the risk of a rare side-effect.  It is best to never miss the  methimazole.  However, if you do miss it, next best is to double up the next time.    Please come back for a follow-up appointment in 2 months.

## 2020-05-21 ENCOUNTER — Other Ambulatory Visit: Payer: Self-pay | Admitting: Endocrinology

## 2020-06-17 ENCOUNTER — Ambulatory Visit: Payer: Managed Care, Other (non HMO) | Admitting: Endocrinology

## 2020-06-17 ENCOUNTER — Encounter: Payer: Self-pay | Admitting: Endocrinology

## 2020-06-17 ENCOUNTER — Other Ambulatory Visit: Payer: Self-pay

## 2020-06-17 VITALS — BP 122/64 | HR 94 | Ht 60.0 in | Wt 148.0 lb

## 2020-06-17 DIAGNOSIS — E119 Type 2 diabetes mellitus without complications: Secondary | ICD-10-CM | POA: Diagnosis not present

## 2020-06-17 DIAGNOSIS — E0590 Thyrotoxicosis, unspecified without thyrotoxic crisis or storm: Secondary | ICD-10-CM | POA: Diagnosis not present

## 2020-06-17 LAB — T4, FREE: Free T4: 0.84 ng/dL (ref 0.60–1.60)

## 2020-06-17 LAB — TSH: TSH: 4.5 u[IU]/mL (ref 0.35–4.50)

## 2020-06-17 LAB — POCT GLYCOSYLATED HEMOGLOBIN (HGB A1C): Hemoglobin A1C: 6.3 % — AB (ref 4.0–5.6)

## 2020-06-17 MED ORDER — METHIMAZOLE 10 MG PO TABS: 10.0000 mg | ORAL_TABLET | Freq: Three times a day (TID) | ORAL | 3 refills | Status: DC

## 2020-06-17 NOTE — Progress Notes (Signed)
Subjective:    Patient ID: Janet Kelley, female    DOB: 03/01/1961, 60 y.o.   MRN: [id number]  HPI Pt returns for f/u of hyperthyroidism (dx'ed 2021; she had a thyroid nodule bx in approx 1995; result is not available, but she says this was benign.  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   Pt also has diabetes mellitus:   DM type: 2 Dx'ed: 2440 Complications: none Therapy: Janumet and Synjardy GDM: never DKA: never Severe hypoglycemia: never Pancreatitis: never Pancreatic imaging: normal on 2021 CT SDOH: none Other: she has never been on insulin; she did not tolerate Trulicity, Byetta, or Victoza (nausea).   Interval history: She says she takes meds as rx'ed.  pt states she feels well in general.   Past Medical History:  Diagnosis Date  . Anxiety   . Cervical dysplasia   . Diabetes mellitus    Type 2  . Elevated cholesterol   . Endometriosis   . Fibroid   . Hyperthyroidism   . Vitamin D deficiency December 2014    Past Surgical History:  Procedure Laterality Date  . CERVICAL DISC SURGERY    . COLPOSCOPY    . GYNECOLOGIC CRYOSURGERY    . KNEE SURGERY     Arthroscopic  . PELVIC LAPAROSCOPY     DL laser endometriosis  . VAGINAL HYSTERECTOMY  2009   [facility]    Social History   Socioeconomic History  . Marital status: Single    Spouse name: Not on file  . Number of children: Not on file  . Years of education: Not on file  . Highest education level: Not on file  Occupational History  . Not on file  Tobacco Use  . Smoking status: Former Smoker    Types: Cigarettes    Quit date: 03/16/2006    Years since quitting: 14.2  . Smokeless tobacco: Never Used  Vaping Use  . Vaping Use: Never used  Substance and Sexual Activity  . Alcohol use: Yes    Alcohol/week: 2.0 standard drinks    Types: 2 Standard drinks or equivalent per week    Comment:  occasiaonal  . Drug use: No  . Sexual activity: Yes    Partners: Male    Birth control/protection: Surgical    Comment: 1st intercourse- 18, partners- greater than 5  Other Topics Concern  . Not on file  Social History Narrative  . Not on file   Social Determinants of Health   Financial Resource Strain: Not on file  Food Insecurity: Not on file  Transportation Needs: Not on file  Physical Activity: Not on file  Stress: Not on file  Social Connections: Not on file  Intimate Partner Violence: Not on file    Current Outpatient Medications on File Prior to Visit  Medication Sig Dispense Refill  . aspirin 81 MG tablet Take 81 mg by mouth daily.    . busPIRone (BUSPAR) 5 MG tablet Take 10 mg by mouth 2 (two) times daily.     . citalopram (CELEXA) 40 MG tablet Take 40 mg by mouth daily.     . clonazePAM (KLONOPIN) 0.5 MG tablet Take 0.5 mg by mouth at bedtime.     Marland Kitchen dexmethylphenidate (FOCALIN) 5 MG tablet Take 10 mg by mouth 2 (two) times daily.    . Empagliflozin-metFORMIN HCl (SYNJARDY) 10-998 MG TABS Take 1 tablet by mouth daily.  30 tablet 11  . esomeprazole (NEXIUM) 20 MG capsule Take 20 mg by mouth daily at 12 noon.    . Estradiol 10 MCG TABS vaginal tablet Place 1 tablet (10 mcg total) vaginally 2 (two) times a week. 24 tablet 4  . ezetimibe (ZETIA) 10 MG tablet Take 10 mg by mouth at bedtime.     . fexofenadine (ALLEGRA) 180 MG tablet Take 180 mg by mouth daily.    . methimazole (TAPAZOLE) 10 MG tablet TAKE 1 TABLET(10 MG) BY MOUTH DAILY 90 tablet 0  . rosuvastatin (CRESTOR) 40 MG tablet Take 40 mg by mouth at bedtime.     . SitaGLIPtin-MetFORMIN HCl (JANUMET XR) [id number] MG TB24 Take 1 tablet by mouth daily. 30 tablet 11   No current facility-administered medications on file prior to visit.    Allergies  Allergen Reactions  . Ceclor [Cefaclor] Rash    Family History  Problem Relation Age of Onset  . Hypertension Mother   . Heart disease Mother   . Diabetes Mother    . Cancer Mother        throat cancer  . Hyperthyroidism Father   . Hypertension Sister   . Heart disease Paternal Uncle   . Colon cancer Paternal Grandmother   . Colon cancer Cousin        STOMACH    BP 122/64   Pulse 94   Ht 5' (1.524 m)   Wt 148 lb (67.1 kg)   SpO2 98%   BMI 28.90 kg/m    Review of Systems Denies fever.      Objective:   Physical Exam VITAL SIGNS:  See vs page GENERAL: no distress Pulses: dorsalis pedis intact bilat.   MSK: no deformity of the feet CV: no leg edema Skin:  no ulcer on the feet.  normal color and temp on the feet. Neuro: sensation is intact to touch on the feet Ext: there is bilateral onychomycosis of the toenails.    Lab Results  Component Value Date   HGBA1C 6.3 (A) 06/17/2020       Assessment & Plan:  Type 2 DM, well-controlled: Please continue the same 3 diabetes medications Hyperthyroidism: recheck today  Patient Instructions  Thyroid blood tests are requested for you today.  We'll let you know about the results.  Please continue the same Synjardy. If ever you have fever while taking methimazole, stop it and call us, even if the reason is obvious, because of the risk of a rare side-effect.  It is best to never miss the methimazole.  However, if you do miss it, next best is to double up the next time.    Please come back for a follow-up appointment in 3 months.

## 2020-06-17 NOTE — Patient Instructions (Signed)
Thyroid blood tests are requested for you today.  We'll let you know about the results.  Please continue the same Synjardy. If ever you have fever while taking methimazole, stop it and call us, even if the reason is obvious, because of the risk of a rare side-effect.  It is best to never miss the methimazole.  However, if you do miss it, next best is to double up the next time.    Please come back for a follow-up appointment in 3 months.

## 2020-06-24 ENCOUNTER — Telehealth: Payer: Self-pay | Admitting: Endocrinology

## 2020-06-24 MED ORDER — METFORMIN HCL ER 500 MG PO TB24: 1000.0000 mg | ORAL_TABLET | Freq: Every day | ORAL | 3 refills | Status: DC

## 2020-06-24 MED ORDER — EMPAGLIFLOZIN 10 MG PO TABS: 10.0000 mg | ORAL_TABLET | Freq: Every day | ORAL | 3 refills | Status: DC

## 2020-06-24 NOTE — Telephone Encounter (Signed)
Patient requests to be called at ph# [phone] re: Patient was prescribed 2 new medications (listed below) that Patient states was never discussed with Dr. Loanne Drilling. metFORMIN (GLUCOPHAGE-XR) 500 MG 24 hr tablet AND  empagliflozin (JARDIANCE) 10 MG TABS tablet

## 2020-06-24 NOTE — Telephone Encounter (Signed)
Notified pt medication sent to the pharmacy.

## 2020-06-24 NOTE — Telephone Encounter (Signed)
please contact patient: Ins declined synjardy.  I have sent a prescription to your pharmacy, for the 2 meds that synjardy contains.

## 2020-06-25 NOTE — Telephone Encounter (Signed)
Verified about the medication.

## 2020-09-16 ENCOUNTER — Other Ambulatory Visit: Payer: Self-pay

## 2020-09-16 ENCOUNTER — Ambulatory Visit: Payer: Managed Care, Other (non HMO) | Admitting: Endocrinology

## 2020-09-16 VITALS — BP 124/60 | HR 110 | Ht 61.0 in | Wt 153.0 lb

## 2020-09-16 DIAGNOSIS — E119 Type 2 diabetes mellitus without complications: Secondary | ICD-10-CM | POA: Diagnosis not present

## 2020-09-16 LAB — POCT GLYCOSYLATED HEMOGLOBIN (HGB A1C): Hemoglobin A1C: 6.8 % — AB (ref 4.0–5.6)

## 2020-09-16 NOTE — Patient Instructions (Addendum)
Thyroid blood tests are requested for you today.  We'll let you know about the results.   Please take just Janumet and Jardiance. If ever you have fever while taking methimazole, stop it and call us, even if the reason is obvious, because of the risk of a rare side-effect.   It is best to never miss the methimazole.  However, if you do miss it, next best is to double up the next time.    Please come back for a follow-up appointment in 3 months.

## 2020-09-16 NOTE — Progress Notes (Signed)
Subjective:    Patient ID: Janet Kelley, female    DOB: Jun 12, 1960, 60 y.o.   MRN: [id number]  HPI Pt returns for f/u of hyperthyroidism (dx'ed 2021; she had a thyroid nodule bx in approx 1995; result is not available, but she says this was benign.  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   Pt also has diabetes mellitus:   DM type: 2 Dx'ed: 3646 Complications: none Therapy: 3 oral meds GDM: never DKA: never Severe hypoglycemia: never Pancreatitis: never Pancreatic imaging: normal on 2021 CT SDOH: none Other: she has never been on insulin; she did not tolerate Trulicity, Byetta, or Victoza (nausea); Jardiance dosage is limited by vaginitis.   Interval history: She says she takes meds as rx'ed.  pt states she feels well in general.  She does not take both janumet and metformin.   Past Medical History:  Diagnosis Date  . Anxiety   . Cervical dysplasia   . Diabetes mellitus    Type 2  . Elevated cholesterol   . Endometriosis   . Fibroid   . Hyperthyroidism   . Vitamin D deficiency December 2014    Past Surgical History:  Procedure Laterality Date  . CERVICAL DISC SURGERY    . COLPOSCOPY    . GYNECOLOGIC CRYOSURGERY    . KNEE SURGERY     Arthroscopic  . PELVIC LAPAROSCOPY     DL laser endometriosis  . VAGINAL HYSTERECTOMY  2009   [facility]    Social History   Socioeconomic History  . Marital status: Single    Spouse name: Not on file  . Number of children: Not on file  . Years of education: Not on file  . Highest education level: Not on file  Occupational History  . Not on file  Tobacco Use  . Smoking status: Former Smoker    Types: Cigarettes    Quit date: 03/16/2006    Years since quitting: 14.5  . Smokeless tobacco: Never Used  Vaping Use  . Vaping Use: Never used  Substance and Sexual Activity  . Alcohol use: Yes    Alcohol/week: 2.0 standard  drinks    Types: 2 Standard drinks or equivalent per week    Comment: occasiaonal  . Drug use: No  . Sexual activity: Yes    Partners: Male    Birth control/protection: Surgical    Comment: 1st intercourse- 18, partners- greater than 5  Other Topics Concern  . Not on file  Social History Narrative  . Not on file   Social Determinants of Health   Financial Resource Strain: Not on file  Food Insecurity: Not on file  Transportation Needs: Not on file  Physical Activity: Not on file  Stress: Not on file  Social Connections: Not on file  Intimate Partner Violence: Not on file    Current Outpatient Medications on File Prior to Visit  Medication Sig Dispense Refill  . aspirin 81 MG tablet Take 81 mg by mouth daily.    . busPIRone (BUSPAR) 5 MG tablet Take 10 mg by mouth 2 (two) times daily.     . citalopram (CELEXA) 40 MG tablet Take 40 mg by mouth daily.     . clonazePAM (KLONOPIN) 0.5 MG tablet Take 0.5 mg by mouth at bedtime.     Marland Kitchen dexmethylphenidate (FOCALIN) 5 MG tablet Take 10 mg by mouth 2 (two) times daily.    Marland Kitchen  empagliflozin (JARDIANCE) 10 MG TABS tablet Take 1 tablet (10 mg total) by mouth daily. 90 tablet 3  . esomeprazole (NEXIUM) 20 MG capsule Take 20 mg by mouth daily at 12 noon.    . Estradiol 10 MCG TABS vaginal tablet Place 1 tablet (10 mcg total) vaginally 2 (two) times a week. 24 tablet 4  . ezetimibe (ZETIA) 10 MG tablet Take 10 mg by mouth at bedtime.     . fexofenadine (ALLEGRA) 180 MG tablet Take 180 mg by mouth daily.    . methimazole (TAPAZOLE) 10 MG tablet TAKE 1 TABLET(10 MG) BY MOUTH DAILY 90 tablet 0  . rosuvastatin (CRESTOR) 40 MG tablet Take 40 mg by mouth at bedtime.     . SitaGLIPtin-MetFORMIN HCl (JANUMET XR) [phone] MG TB24 Take 1 tablet by mouth daily. 30 tablet 11   No current facility-administered medications on file prior to visit.    Allergies  Allergen Reactions  . Ceclor [Cefaclor] Rash    Family History  Problem Relation Age of  Onset  . Hypertension Mother   . Heart disease Mother   . Diabetes Mother   . Cancer Mother        throat cancer  . Hyperthyroidism Father   . Hypertension Sister   . Heart disease Paternal Uncle   . Colon cancer Paternal Grandmother   . Colon cancer Cousin        STOMACH    BP 124/60 (BP Location: Right Arm, Patient Position: Sitting, Cuff Size: Normal)   Pulse (!) 110   Ht 5\' 1"  (1.549 m)   Wt 153 lb (69.4 kg)   SpO2 97%   BMI 28.91 kg/m    Review of Systems     Objective:   Physical Exam VITAL SIGNS:  See vs page GENERAL: no distress Pulses: dorsalis pedis intact bilat.   MSK: no deformity of the feet CV: no leg edema Skin:  no ulcer on the feet.  normal color and temp on the feet.  Neuro: sensation is intact to touch on the feet.     Lab Results  Component Value Date   HGBA1C 6.8 (A) 09/16/2020   Lab Results  Component Value Date   TSH 4.50 06/17/2020   T3TOTAL 319 (H) 06/12/2019       Assessment & Plan:  Hyperthyroidism: well-controlled.  Please continue the same methimazole Type 2 DM: well-controlled  Patient Instructions  Thyroid blood tests are requested for you today.  We'll let you know about the results.   Please take just Janumet and Jardiance. If ever you have fever while taking methimazole, stop it and call us, even if the reason is obvious, because of the risk of a rare side-effect.   It is best to never miss the methimazole.  However, if you do miss it, next best is to double up the next time.    Please come back for a follow-up appointment in 3 months.

## 2020-12-19 ENCOUNTER — Other Ambulatory Visit (INDEPENDENT_AMBULATORY_CARE_PROVIDER_SITE_OTHER): Payer: Managed Care, Other (non HMO)

## 2020-12-19 ENCOUNTER — Ambulatory Visit: Payer: Managed Care, Other (non HMO) | Admitting: Endocrinology

## 2020-12-19 ENCOUNTER — Other Ambulatory Visit: Payer: Self-pay

## 2020-12-19 VITALS — BP 110/78 | HR 93 | Ht 61.0 in | Wt 146.8 lb

## 2020-12-19 DIAGNOSIS — E0590 Thyrotoxicosis, unspecified without thyrotoxic crisis or storm: Secondary | ICD-10-CM

## 2020-12-19 DIAGNOSIS — E119 Type 2 diabetes mellitus without complications: Secondary | ICD-10-CM | POA: Diagnosis not present

## 2020-12-19 LAB — POCT GLYCOSYLATED HEMOGLOBIN (HGB A1C): Hemoglobin A1C: 6.5 % — AB (ref 4.0–5.6)

## 2020-12-19 LAB — BASIC METABOLIC PANEL
BUN: 14 mg/dL (ref 6–23)
CO2: 28 mEq/L (ref 19–32)
Calcium: 10 mg/dL (ref 8.4–10.5)
Chloride: 101 mEq/L (ref 96–112)
Creatinine, Ser: 0.86 mg/dL (ref 0.40–1.20)
GFR: 74.05 mL/min (ref >60.00)
Glucose, Bld: 123 mg/dL — ABNORMAL HIGH (ref 70–99)
Potassium: 3.8 mEq/L (ref 3.5–5.1)
Sodium: 140 mEq/L (ref 135–145)

## 2020-12-19 LAB — TSH: TSH: 1.99 u[IU]/mL (ref 0.35–5.50)

## 2020-12-19 LAB — T4, FREE: Free T4: 0.91 ng/dL (ref 0.60–1.60)

## 2020-12-19 NOTE — Patient Instructions (Addendum)
blood tests are requested for you today.  We'll let you know about the results.   Please continue the same Janumet and Jardiance.   If ever you have fever while taking methimazole, stop it and call us, even if the reason is obvious, because of the risk of a rare side-effect.   It is best to never miss the methimazole.  However, if you do miss it, next best is to double up the next time.    Please come back for a follow-up appointment in 6 months.

## 2020-12-19 NOTE — Progress Notes (Signed)
Subjective:    Patient ID: Janet Kelley, female    DOB: Jan 14, 1961, 60 y.o.   MRN: [id number]  HPI Pt returns for f/u of hyperthyroidism (dx'ed 2021; she had a thyroid nodule bx in approx 1995; result is not available, but she says this was benign.  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   Pt also has diabetes mellitus:   DM type: 2 Dx'ed: 2992 Complications: none Therapy: 3 oral meds GDM: never DKA: never Severe hypoglycemia: never Pancreatitis: never Pancreatic imaging: normal on 2021 CT SDOH: none Other: she has never been on insulin; she did not tolerate Trulicity, Byetta, or Victoza (nausea); Jardiance dosage is limited by vaginitis.   Interval history: She says she takes meds as rx'ed.  pt states she feels well in general.  She says cbg's are in the low-100's.  Past Medical History:  Diagnosis Date   Anxiety    Cervical dysplasia    Diabetes mellitus    Type 2   Elevated cholesterol    Endometriosis    Fibroid    Hyperthyroidism    Vitamin D deficiency December 2014    Past Surgical History:  Procedure Laterality Date   CERVICAL DISC SURGERY     COLPOSCOPY     GYNECOLOGIC CRYOSURGERY     KNEE SURGERY     Arthroscopic   PELVIC LAPAROSCOPY     DL laser endometriosis   VAGINAL HYSTERECTOMY  2009   [facility]    Social History   Socioeconomic History   Marital status: Single    Spouse name: Not on file   Number of children: Not on file   Years of education: Not on file   Highest education level: Not on file  Occupational History   Not on file  Tobacco Use   Smoking status: Former    Types: Cigarettes    Quit date: 03/16/2006    Years since quitting: 14.7   Smokeless tobacco: Never  Vaping Use   Vaping Use: Never used  Substance and Sexual Activity   Alcohol use: Yes    Alcohol/week: 2.0 standard drinks    Types: 2 Standard drinks or equivalent  per week    Comment: occasiaonal   Drug use: No   Sexual activity: Yes    Partners: Male    Birth control/protection: Surgical    Comment: 1st intercourse- 18, partners- greater than 5  Other Topics Concern   Not on file  Social History Narrative   Not on file   Social Determinants of Health   Financial Resource Strain: Not on file  Food Insecurity: Not on file  Transportation Needs: Not on file  Physical Activity: Not on file  Stress: Not on file  Social Connections: Not on file  Intimate Partner Violence: Not on file    Current Outpatient Medications on File Prior to Visit  Medication Sig Dispense Refill   aspirin 81 MG tablet Take 81 mg by mouth daily.     busPIRone (BUSPAR) 5 MG tablet Take 10 mg by mouth 2 (two) times daily.      citalopram (CELEXA) 40 MG tablet Take 40 mg by mouth daily.      clonazePAM (KLONOPIN) 0.5 MG tablet Take 0.5 mg by mouth at bedtime.      dexmethylphenidate (FOCALIN) 5 MG tablet Take 10 mg by mouth 2 (two) times daily.     empagliflozin (JARDIANCE)  10 MG TABS tablet Take 1 tablet (10 mg total) by mouth daily. 90 tablet 3   esomeprazole (NEXIUM) 20 MG capsule Take 20 mg by mouth daily at 12 noon.     Estradiol 10 MCG TABS vaginal tablet Place 1 tablet (10 mcg total) vaginally 2 (two) times a week. 24 tablet 4   ezetimibe (ZETIA) 10 MG tablet Take 10 mg by mouth at bedtime.      fexofenadine (ALLEGRA) 180 MG tablet Take 180 mg by mouth daily.     methimazole (TAPAZOLE) 10 MG tablet TAKE 1 TABLET(10 MG) BY MOUTH DAILY 90 tablet 0   rosuvastatin (CRESTOR) 40 MG tablet Take 40 mg by mouth at bedtime.      SitaGLIPtin-MetFORMIN HCl (JANUMET XR) [phone] MG TB24 Take 1 tablet by mouth daily. 30 tablet 11   No current facility-administered medications on file prior to visit.    Allergies  Allergen Reactions   Ceclor [Cefaclor] Rash    Family History  Problem Relation Age of Onset   Hypertension Mother    Heart disease Mother    Diabetes  Mother    Cancer Mother        throat cancer   Hyperthyroidism Father    Hypertension Sister    Heart disease Paternal Uncle    Colon cancer Paternal Grandmother    Colon cancer Cousin        STOMACH    BP 110/78 (BP Location: Right Arm, Patient Position: Sitting, Cuff Size: Normal)   Pulse 93   Ht 5\' 1"  (1.549 m)   Wt 146 lb 12.8 oz (66.6 kg)   SpO2 98%   BMI 27.74 kg/m    Review of Systems Denies fever    Objective:   Physical Exam Pulses: dorsalis pedis intact bilat.   MSK: no deformity of the feet CV: no leg edema Skin:  no ulcer on the feet.  normal color and temp on the feet. Neuro: sensation is intact to touch on the feet.     A1c=6.5%     Assessment & Plan:  Type 2 DM: well-controlled Hyperthyroidism: recheck today  Patient Instructions  blood tests are requested for you today.  We'll let you know about the results.   Please continue the same Janumet and Jardiance.   If ever you have fever while taking methimazole, stop it and call us, even if the reason is obvious, because of the risk of a rare side-effect.   It is best to never miss the methimazole.  However, if you do miss it, next best is to double up the next time.    Please come back for a follow-up appointment in 6 months.

## 2021-03-04 ENCOUNTER — Telehealth: Payer: Self-pay | Admitting: Endocrinology

## 2021-03-04 ENCOUNTER — Other Ambulatory Visit: Payer: Self-pay | Admitting: Endocrinology

## 2021-03-04 DIAGNOSIS — E0590 Thyrotoxicosis, unspecified without thyrotoxic crisis or storm: Secondary | ICD-10-CM

## 2021-03-04 NOTE — Telephone Encounter (Signed)
Pt claims to not feeling well lately. Was seen at her walk-in clinic and feels she needs her thyroid checked. Would like Loanne Drilling to put in orders to check thyroid, so she can come and get labs drawn. Pt contact [phone]

## 2021-03-04 NOTE — Telephone Encounter (Signed)
Message sent thru MyChart 

## 2021-03-05 ENCOUNTER — Other Ambulatory Visit: Payer: Self-pay

## 2021-03-05 ENCOUNTER — Other Ambulatory Visit (INDEPENDENT_AMBULATORY_CARE_PROVIDER_SITE_OTHER): Payer: Managed Care, Other (non HMO)

## 2021-03-05 DIAGNOSIS — E0590 Thyrotoxicosis, unspecified without thyrotoxic crisis or storm: Secondary | ICD-10-CM | POA: Diagnosis not present

## 2021-03-05 LAB — TSH: TSH: 2.54 u[IU]/mL (ref 0.35–5.50)

## 2021-03-05 LAB — T4, FREE: Free T4: 0.94 ng/dL (ref 0.60–1.60)

## 2021-05-11 ENCOUNTER — Other Ambulatory Visit: Payer: Self-pay | Admitting: Endocrinology

## 2021-06-08 ENCOUNTER — Other Ambulatory Visit: Payer: Self-pay | Admitting: Endocrinology

## 2021-06-23 ENCOUNTER — Ambulatory Visit: Payer: Managed Care, Other (non HMO) | Admitting: Endocrinology

## 2021-06-23 ENCOUNTER — Other Ambulatory Visit: Payer: Self-pay

## 2021-06-23 VITALS — BP 140/78 | HR 88 | Ht 61.0 in

## 2021-06-23 DIAGNOSIS — E119 Type 2 diabetes mellitus without complications: Secondary | ICD-10-CM

## 2021-06-23 DIAGNOSIS — E0590 Thyrotoxicosis, unspecified without thyrotoxic crisis or storm: Secondary | ICD-10-CM | POA: Diagnosis not present

## 2021-06-23 LAB — POCT GLYCOSYLATED HEMOGLOBIN (HGB A1C): Hemoglobin A1C: 6.4 % — AB (ref 4.0–5.6)

## 2021-06-23 LAB — TSH: TSH: 3.12 u[IU]/mL (ref 0.35–5.50)

## 2021-06-23 LAB — T4, FREE: Free T4: 1.05 ng/dL (ref 0.60–1.60)

## 2021-06-23 MED ORDER — EMPAGLIFLOZIN 10 MG PO TABS: 10.0000 mg | ORAL_TABLET | Freq: Every day | ORAL | 3 refills | Status: DC

## 2021-06-23 MED ORDER — JANUMET XR 100-1000 MG PO TB24: 1.0000 | ORAL_TABLET | Freq: Every day | ORAL | 3 refills | Status: DC

## 2021-06-23 MED ORDER — METHIMAZOLE 10 MG PO TABS: 10.0000 mg | ORAL_TABLET | Freq: Every day | ORAL | 3 refills | Status: DC

## 2021-06-23 NOTE — Progress Notes (Signed)
Subjective:    Patient ID: Janet Kelley, female    DOB: 1960-08-10, 61 y.o.   MRN: [id number]  HPI Pt returns for f/u of hyperthyroidism (dx'ed 2021; she had a thyroid nodule bx in approx 1995; result is not available, but she says this was benign.  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   Pt also has diabetes mellitus:  DM type: 2 Dx'ed: 9924 Complications: none Therapy: 3 oral meds GDM: never DKA: never Severe hypoglycemia: never Pancreatitis: never Pancreatic imaging: normal on 2021 CT SDOH: none Other: she has never been on insulin; she did not tolerate Trulicity, Byetta, or Victoza (nausea); Jardiance dosage is limited by vaginitis.   Interval history: She says she takes meds as rx'ed.  pt states she feels well in general.  She says cbg's are well-controlled.   Past Medical History:  Diagnosis Date   Anxiety    Cervical dysplasia    Diabetes mellitus    Type 2   Elevated cholesterol    Endometriosis    Fibroid    Hyperthyroidism    Vitamin D deficiency December 2014    Past Surgical History:  Procedure Laterality Date   CERVICAL DISC SURGERY     COLPOSCOPY     GYNECOLOGIC CRYOSURGERY     KNEE SURGERY     Arthroscopic   PELVIC LAPAROSCOPY     DL laser endometriosis   VAGINAL HYSTERECTOMY  2009   [facility]    Social History   Socioeconomic History   Marital status: Single    Spouse name: Not on file   Number of children: Not on file   Years of education: Not on file   Highest education level: Not on file  Occupational History   Not on file  Tobacco Use   Smoking status: Former    Types: Cigarettes    Quit date: 03/16/2006    Years since quitting: 15.2   Smokeless tobacco: Never  Vaping Use   Vaping Use: Never used  Substance and Sexual Activity   Alcohol use: Yes    Alcohol/week: 2.0 standard drinks    Types: 2 Standard drinks or equivalent  per week    Comment: occasiaonal   Drug use: No   Sexual activity: Yes    Partners: Male    Birth control/protection: Surgical    Comment: 1st intercourse- 18, partners- greater than 5  Other Topics Concern   Not on file  Social History Narrative   Not on file   Social Determinants of Health   Financial Resource Strain: Not on file  Food Insecurity: Not on file  Transportation Needs: Not on file  Physical Activity: Not on file  Stress: Not on file  Social Connections: Not on file  Intimate Partner Violence: Not on file    Current Outpatient Medications on File Prior to Visit  Medication Sig Dispense Refill   aspirin 81 MG tablet Take 81 mg by mouth daily.     busPIRone (BUSPAR) 5 MG tablet Take 10 mg by mouth 2 (two) times daily.      citalopram (CELEXA) 40 MG tablet Take 40 mg by mouth daily.      clonazePAM (KLONOPIN) 0.5 MG tablet Take 0.5 mg by mouth at bedtime.      dexmethylphenidate (FOCALIN) 5 MG tablet Take 10 mg by mouth 2 (two) times daily.     esomeprazole (NEXIUM) 20 MG  capsule Take 20 mg by mouth daily at 12 noon.     Estradiol 10 MCG TABS vaginal tablet Place 1 tablet (10 mcg total) vaginally 2 (two) times a week. 24 tablet 4   ezetimibe (ZETIA) 10 MG tablet Take 10 mg by mouth at bedtime.      fexofenadine (ALLEGRA) 180 MG tablet Take 180 mg by mouth daily.     rosuvastatin (CRESTOR) 40 MG tablet Take 40 mg by mouth at bedtime.      No current facility-administered medications on file prior to visit.    Allergies  Allergen Reactions   Ceclor [Cefaclor] Rash    Family History  Problem Relation Age of Onset   Hypertension Mother    Heart disease Mother    Diabetes Mother    Cancer Mother        throat cancer   Hyperthyroidism Father    Hypertension Sister    Heart disease Paternal Uncle    Colon cancer Paternal Grandmother    Colon cancer Cousin        STOMACH    BP 140/78    Pulse 88    Ht 5\' 1"  (1.549 m)    SpO2 98%    BMI 27.74 kg/m     Review of Systems Denies fever    Objective:   Physical Exam VITAL SIGNS:  See vs page GENERAL: no distress NECK: There is no palpable thyroid enlargement.  No thyroid nodule is palpable.  No palpable lymphadenopathy at the anterior neck.     Lab Results  Component Value Date   HGBA1C 6.4 (A) 06/23/2021   Lab Results  Component Value Date   TSH 3.12 06/23/2021   T3TOTAL 319 (H) 06/12/2019       Assessment & Plan:  Type 2 DM: well-controlled Hyperthyroidism: recheck today  Patient Instructions  blood tests are requested for you today.  We'll let you know about the results.   Please continue the same Janumet and Jardiance.   If ever you have fever while taking methimazole, stop it and call us, even if the reason is obvious, because of the risk of a rare side-effect.   It is best to never miss the methimazole.  However, if you do miss it, next best is to double up the next time.    Please come back for a follow-up appointment in 6 months.

## 2021-06-23 NOTE — Patient Instructions (Signed)
blood tests are requested for you today.  We'll let you know about the results.   Please continue the same Janumet and Jardiance.   If ever you have fever while taking methimazole, stop it and call us, even if the reason is obvious, because of the risk of a rare side-effect.   It is best to never miss the methimazole.  However, if you do miss it, next best is to double up the next time.    Please come back for a follow-up appointment in 6 months.

## 2021-07-15 ENCOUNTER — Encounter: Payer: Self-pay | Admitting: Obstetrics & Gynecology

## 2021-07-30 ENCOUNTER — Other Ambulatory Visit: Payer: Self-pay | Admitting: Gastroenterology

## 2021-07-30 DIAGNOSIS — R1084 Generalized abdominal pain: Secondary | ICD-10-CM

## 2021-08-06 ENCOUNTER — Ambulatory Visit
Admission: RE | Admit: 2021-08-06 | Discharge: 2021-08-06 | Disposition: A | Discharge status: Home / Self Care | Payer: Managed Care, Other (non HMO) | Source: Ambulatory Visit | Attending: Gastroenterology | Admitting: Gastroenterology

## 2021-08-06 DIAGNOSIS — R1084 Generalized abdominal pain: Secondary | ICD-10-CM

## 2021-08-14 ENCOUNTER — Ambulatory Visit (HOSPITAL_COMMUNITY)
Admission: EM | Admit: 2021-08-14 | Discharge: 2021-08-14 | Disposition: A | Discharge status: Home / Self Care | Payer: Managed Care, Other (non HMO)

## 2021-08-14 ENCOUNTER — Encounter (HOSPITAL_COMMUNITY): Payer: Self-pay | Admitting: Emergency Medicine

## 2021-08-14 ENCOUNTER — Ambulatory Visit (INDEPENDENT_AMBULATORY_CARE_PROVIDER_SITE_OTHER): Payer: Managed Care, Other (non HMO)

## 2021-08-14 ENCOUNTER — Other Ambulatory Visit: Payer: Self-pay

## 2021-08-14 DIAGNOSIS — M25561 Pain in right knee: Secondary | ICD-10-CM | POA: Diagnosis not present

## 2021-08-14 DIAGNOSIS — M7918 Myalgia, other site: Secondary | ICD-10-CM

## 2021-08-14 DIAGNOSIS — M25562 Pain in left knee: Secondary | ICD-10-CM

## 2021-08-14 MED ORDER — TIZANIDINE HCL 4 MG PO TABS: 4.0000 mg | ORAL_TABLET | Freq: Four times a day (QID) | ORAL | 0 refills | Status: DC | PRN
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
## 2021-08-14 NOTE — Discharge Instructions (Addendum)
Please make sure you are staying active with light physical activity over the next couple of days.  Be sure you are drinking plenty of water.  You can alternate Tylenol and ibuprofen for the pain.  You can also use warm and cold compresses.  At night time, you can also use tizanidine 4 mg daily up to every 6 hours as needed for muscle pain.  Follow up with primary care provider if your symptoms are not slowing improving.  ?
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
## 2021-09-21 ENCOUNTER — Encounter: Payer: Self-pay | Admitting: Endocrinology

## 2021-09-21 ENCOUNTER — Ambulatory Visit: Payer: Managed Care, Other (non HMO) | Admitting: Endocrinology

## 2021-09-21 VITALS — BP 122/72 | HR 94 | Ht 61.0 in | Wt 136.6 lb

## 2021-09-21 DIAGNOSIS — E119 Type 2 diabetes mellitus without complications: Secondary | ICD-10-CM

## 2021-09-21 DIAGNOSIS — E0590 Thyrotoxicosis, unspecified without thyrotoxic crisis or storm: Secondary | ICD-10-CM | POA: Diagnosis not present

## 2021-09-21 LAB — TSH: TSH: 2.91 u[IU]/mL (ref 0.35–5.50)

## 2021-09-21 LAB — T4, FREE: Free T4: 0.94 ng/dL (ref 0.60–1.60)

## 2021-09-21 LAB — HEMOGLOBIN A1C: Hgb A1c MFr Bld: 6.2 % (ref 4.6–6.5)

## 2021-09-21 NOTE — Progress Notes (Signed)
? ?Subjective:  ? ? Patient ID: Janet Kelley, female    DOB: 04/19/1961, 61 y.o.   MRN: [id number] ? ?HPI ?Pt returns for f/u of hyperthyroidism (dx'ed 2021; she had a thyroid nodule bx in approx 1995; result is not available, but she says this was benign.  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   ?Pt also has diabetes mellitus:  ?DM type: 2 ?Dx'ed: 2009 ?Complications: none ?Therapy: 3 oral meds ?GDM: never ?DKA: never ?Severe hypoglycemia: never ?Pancreatitis: never ?Pancreatic imaging: normal on 2021 CT.   ?SDOH: none ?Other: she has never been on insulin; she did not tolerate Trulicity, Byetta, or Victoza (nausea); Jardiance dosage is limited by vaginitis.   ?Interval history: She says she takes meds as rx'ed.  pt states she feels well in general.  She says cbg's are well-controlled.   ?Past Medical History:  ?Diagnosis Date  ? Anxiety   ? Cervical dysplasia   ? Diabetes mellitus   ? Type 2  ? Elevated cholesterol   ? Endometriosis   ? Fibroid   ? Hyperthyroidism   ? Vitamin D deficiency December 2014  ? ? ?Past Surgical History:  ?Procedure Laterality Date  ? CERVICAL DISC SURGERY    ? COLPOSCOPY    ? GYNECOLOGIC CRYOSURGERY    ? KNEE SURGERY    ? Arthroscopic  ? PELVIC LAPAROSCOPY    ? DL laser endometriosis  ? VAGINAL HYSTERECTOMY  2009  ? LAVH  ? ? ?Social History  ? ?Socioeconomic History  ? Marital status: Single  ?  Spouse name: Not on file  ? Number of children: Not on file  ? Years of education: Not on file  ? Highest education level: Not on file  ?Occupational History  ? Not on file  ?Tobacco Use  ? Smoking status: Former  ?  Types: Cigarettes  ?  Quit date: 03/16/2006  ?  Years since quitting: 15.5  ? Smokeless tobacco: Never  ?Vaping Use  ? Vaping Use: Never used  ?Substance and Sexual Activity  ? Alcohol use: Yes  ?  Alcohol/week: 2.0 standard drinks  ?  Types: 2 Standard drinks or  equivalent per week  ?  Comment: occasiaonal  ? Drug use: No  ? Sexual activity: Yes  ?  Partners: Male  ?  Birth control/protection: Surgical  ?  Comment: 1st intercourse- 18, partners- greater than 5  ?Other Topics Concern  ? Not on file  ?Social History Narrative  ? Not on file  ? ?Social Determinants of Health  ? ?Financial Resource Strain: Not on file  ?Food Insecurity: Not on file  ?Transportation Needs: Not on file  ?Physical Activity: Not on file  ?Stress: Not on file  ?Social Connections: Not on file  ?Intimate Partner Violence: Not on file  ? ? ?Current Outpatient Medications on File Prior to Visit  ?Medication Sig Dispense Refill  ? aspirin 81 MG tablet Take 81 mg by mouth daily.    ? busPIRone (BUSPAR) 5 MG tablet Take 10 mg by mouth 2 (two) times daily.     ? citalopram (CELEXA) 40 MG tablet Take 40 mg by mouth daily.     ? clonazePAM (KLONOPIN) 0.5 MG tablet Take 0.5 mg by mouth at bedtime.     ? dexmethylphenidate (FOCALIN) 5 MG tablet Take 10 mg by mouth 2 (two) times daily.    ? empagliflozin (JARDIANCE)  10 MG TABS tablet Take 1 tablet (10 mg total) by mouth daily. 90 tablet 3  ? esomeprazole (NEXIUM) 20 MG capsule Take 20 mg by mouth daily at 12 noon.    ? ezetimibe (ZETIA) 10 MG tablet Take 10 mg by mouth at bedtime.     ? fexofenadine (ALLEGRA) 180 MG tablet Take 180 mg by mouth daily.    ? lisdexamfetamine (VYVANSE) 70 MG capsule Take 70 mg by mouth daily.    ? methimazole (TAPAZOLE) 10 MG tablet Take 1 tablet (10 mg total) by mouth daily. 90 tablet 3  ? omeprazole (PRILOSEC) 40 MG capsule Take 40 mg by mouth daily.    ? potassium chloride (KLOR-CON) 10 MEQ tablet Take 10 mEq by mouth daily.    ? rosuvastatin (CRESTOR) 40 MG tablet Take 40 mg by mouth at bedtime.     ? SitaGLIPtin-MetFORMIN HCl (JANUMET XR) [phone] MG TB24 Take 1 tablet by mouth daily. 90 tablet 3  ? tiZANidine (ZANAFLEX) 4 MG tablet Take 1 tablet (4 mg total) by mouth every 6 (six) hours as needed for muscle spasms. Do not  take while driving or operating heavy machinery. 30 tablet 0  ? ?No current facility-administered medications on file prior to visit.  ? ? ?Allergies  ?Allergen Reactions  ? Ceclor [Cefaclor] Rash  ? ? ?Family History  ?Problem Relation Age of Onset  ? Hypertension Mother   ? Heart disease Mother   ? Diabetes Mother   ? Cancer Mother   ?     throat cancer  ? Hyperthyroidism Father   ? Hypertension Sister   ? Heart disease Paternal Uncle   ? Colon cancer Paternal Grandmother   ? Colon cancer Cousin   ?     STOMACH  ? ? ?BP 122/72 (BP Location: Left Arm, Patient Position: Sitting, Cuff Size: Normal)   Pulse 94   Ht '5\' 1"'$  (1.549 m)   Wt 136 lb 9.6 oz (62 kg)   SpO2 95%   BMI 25.81 kg/m?  ? ? ?Review of Systems ?Denies fever.   ?   ?Objective:  ? Physical Exam ?VITAL SIGNS:  See vs page ?GENERAL: no distress ?Neck: a healed scar is present.  I do not appreciate a nodule in the thyroid or elsewhere in the neck.   ? ? ?Lab Results  ?Component Value Date  ? TSH 2.91 09/21/2021  ? T3TOTAL 319 (H) 06/12/2019  ? ?Lab Results  ?Component Value Date  ? HGBA1C 6.2 09/21/2021  ? ?   ?Assessment & Plan:  ?Hyperthyroidism: well-controlled.  Please continue the same methimazole. ?Type 2 DM: well-controlled.  Please continue the same 3 diabetes medications ? ?

## 2021-09-21 NOTE — Patient Instructions (Signed)
blood tests are requested for you today.  We'll let you know about the results.   ?Please continue the same Janumet and Jardiance.   ?If ever you have fever while taking methimazole, stop it and call us, even if the reason is obvious, because of the risk of a rare side-effect.   ?It is best to never miss the methimazole.  However, if you do miss it, next best is to double up the next time.    ?You should have an endocrinology follow-up appointment in 6 months.  ?

## 2021-09-24 ENCOUNTER — Ambulatory Visit: Payer: Self-pay | Admitting: Surgery

## 2021-11-10 NOTE — Progress Notes (Signed)
COVID Vaccine Completed:yes x4  Date of COVID positive in last 90 days: last month, home test  PCP - Lennie Odor, PA Cardiologist - n/a  Chest x-ray - n/a EKG - 11/11/21 Epic/chart Stress Test - 12/20/16 Epic ECHO - 11/20/16 [facility] Cardiac Cath - n/a Pacemaker/ICD device last checked: n/a Spinal Cord Stimulator: n/a  Bowel Prep - no  Sleep Study - n/a CPAP -   Fasting Blood Sugar - 90-130 Checks Blood Sugar  2 times a week  Blood Thinner Instructions: Aspirin Instructions: ASA 81, on hold due stomach issues per pt Last Dose:  Activity level: Can go up a flight of stairs and perform activities of daily living without stopping and without symptoms of chest pain or shortness of breath.    Anesthesia review:   Patient denies shortness of breath, fever, cough and chest pain at PAT appointment  Patient verbalized understanding of instructions that were given to them at the PAT appointment. Patient was also instructed that they will need to review over the PAT instructions again at home before surgery.

## 2021-11-11 ENCOUNTER — Encounter (HOSPITAL_COMMUNITY)
Admission: RE | Admit: 2021-11-11 | Discharge: 2021-11-11 | Disposition: A | Discharge status: Home / Self Care | Payer: Managed Care, Other (non HMO) | Source: Ambulatory Visit | Attending: Surgery | Admitting: Surgery

## 2021-11-11 ENCOUNTER — Encounter (HOSPITAL_COMMUNITY): Payer: Self-pay

## 2021-11-11 VITALS — BP 125/75 | HR 92 | Temp 98.8°F | Resp 14 | Ht 60.25 in | Wt 135.0 lb

## 2021-11-11 DIAGNOSIS — Z01818 Encounter for other preprocedural examination: Secondary | ICD-10-CM | POA: Diagnosis not present

## 2021-11-11 DIAGNOSIS — E119 Type 2 diabetes mellitus without complications: Secondary | ICD-10-CM | POA: Diagnosis not present

## 2021-11-11 DIAGNOSIS — I2510 Atherosclerotic heart disease of native coronary artery without angina pectoris: Secondary | ICD-10-CM | POA: Diagnosis not present

## 2021-11-11 DIAGNOSIS — E0590 Thyrotoxicosis, unspecified without thyrotoxic crisis or storm: Secondary | ICD-10-CM

## 2021-11-11 HISTORY — DX: Attention-deficit hyperactivity disorder, unspecified type: F90.9

## 2021-11-11 HISTORY — DX: Gastro-esophageal reflux disease without esophagitis: K21.9

## 2021-11-11 LAB — CBC
HCT: 32.6 % — ABNORMAL LOW (ref 36.0–46.0)
Hemoglobin: 10 g/dL — ABNORMAL LOW (ref 12.0–15.0)
MCH: 28.2 pg (ref 26.0–34.0)
MCHC: 30.7 g/dL (ref 30.0–36.0)
MCV: 91.8 fL (ref 80.0–100.0)
Platelets: 248 10*3/uL (ref 150–400)
RBC: 3.55 MIL/uL — ABNORMAL LOW (ref 3.87–5.11)
RDW: 13.8 % (ref 11.5–15.5)
WBC: 3.2 10*3/uL — ABNORMAL LOW (ref 4.0–10.5)
nRBC: 0 % (ref 0.0–0.2)

## 2021-11-11 LAB — BASIC METABOLIC PANEL
Anion gap: 10 (ref 5–15)
BUN: 12 mg/dL (ref 6–20)
CO2: 29 mmol/L (ref 22–32)
Calcium: 9.8 mg/dL (ref 8.9–10.3)
Chloride: 104 mmol/L (ref 98–111)
Creatinine, Ser: 0.69 mg/dL (ref 0.44–1.00)
GFR, Estimated: >60 mL/min (ref >60)
Glucose, Bld: 107 mg/dL — ABNORMAL HIGH (ref 70–99)
Potassium: 3.6 mmol/L (ref 3.5–5.1)
Sodium: 143 mmol/L (ref 135–145)

## 2021-11-11 LAB — HEMOGLOBIN A1C
Hgb A1c MFr Bld: 6.1 % — ABNORMAL HIGH (ref 4.8–5.6)
Mean Plasma Glucose: 128.37 mg/dL

## 2021-11-11 LAB — GLUCOSE, CAPILLARY: Glucose-Capillary: 131 mg/dL — ABNORMAL HIGH (ref 70–99)

## 2021-11-11 NOTE — Patient Instructions (Signed)
DUE TO COVID-19 ONLY TWO VISITORS  (aged 60 and older)  ARE ALLOWED TO COME WITH YOU AND STAY IN THE WAITING ROOM ONLY DURING PRE OP AND PROCEDURE.   **NO VISITORS ARE ALLOWED IN THE SHORT STAY AREA OR RECOVERY ROOM!!**    Your procedure is scheduled on: 11/23/21   Report to [facility] Main Entrance    Report to admitting at 5:15 AM   Call this number if you have problems the morning of surgery [phone]   Do not eat food :After Midnight.   After Midnight you may have the following liquids until 4:30 AM DAY OF SURGERY  Water Black Coffee (sugar ok, NO MILK/CREAM OR CREAMERS)  Tea (sugar ok, NO MILK/CREAM OR CREAMERS) regular and decaf                             Plain Jell-O (NO RED)                                           Fruit ices (not with fruit pulp, NO RED)                                     Popsicles (NO RED)                                                                  Juice: apple, WHITE grape, WHITE cranberry Sports drinks like Gatorade (NO RED) Clear broth(vegetable,chicken,beef)  FOLLOW BOWEL PREP AND ANY ADDITIONAL PRE OP INSTRUCTIONS YOU RECEIVED FROM YOUR SURGEON'S OFFICE!!!     Oral Hygiene is also important to reduce your risk of infection.                                    Remember - BRUSH YOUR TEETH THE MORNING OF SURGERY WITH YOUR REGULAR TOOTHPASTE   Do NOT smoke after Midnight   Take these medicines the morning of surgery with A SIP OF WATER: Tylenol, Buspirone, Clonazepam, Allegra, Methimazole, Omeprazole, Zofran  DO NOT TAKE ANY ORAL DIABETIC MEDICATIONS DAY OF YOUR SURGERY  How to Manage Your Diabetes Before and After Surgery  Why is it important to control my blood sugar before and after surgery? Improving blood sugar levels before and after surgery helps healing and can limit problems. A way of improving blood sugar control is eating a healthy diet by:  Eating less sugar and carbohydrates  Increasing activity/exercise  Talking  with your doctor about reaching your blood sugar goals High blood sugars (greater than 180 mg/dL) can raise your risk of infections and slow your recovery, so you will need to focus on controlling your diabetes during the weeks before surgery. Make sure that the doctor who takes care of your diabetes knows about your planned surgery including the date and location.  How do I manage my blood sugar before surgery? Check your blood sugar at least 4 times a day, starting 2 days before surgery, to  make sure that the level is not too high or low. Check your blood sugar the morning of your surgery when you wake up and every 2 hours until you get to the Short Stay unit. If your blood sugar is less than 70 mg/dL, you will need to treat for low blood sugar: Do not take insulin. Treat a low blood sugar (less than 70 mg/dL) with  cup of clear juice (cranberry or apple), 4 glucose tablets, OR glucose gel. Recheck blood sugar in 15 minutes after treatment (to make sure it is greater than 70 mg/dL). If your blood sugar is not greater than 70 mg/dL on recheck, call [phone] for further instructions. Report your blood sugar to the short stay nurse when you get to Short Stay.  If you are admitted to the hospital after surgery: Your blood sugar will be checked by the staff and you will probably be given insulin after surgery (instead of oral diabetes medicines) to make sure you have good blood sugar levels. The goal for blood sugar control after surgery is 80-180 mg/dL.   WHAT DO I DO ABOUT MY DIABETES MEDICATION?  Do not take oral diabetes medicines (pills) the morning of surgery.  THE DAY BEFORE SURGERY, take Janumet as prescribed. Do not take Jardiance       THE MORNING OF SURGERY, do not take Janumet  or Jardiance   Reviewed and Endorsed by [facility] Patient Education Committee, August 2015   Bring CPAP mask and tubing day of surgery.                              You may not have any metal on  your body including hair pins, jewelry, and body piercing             Do not wear make-up, lotions, powders, perfumes, or deodorant  Do not wear nail polish including gel and S&S, artificial/acrylic nails, or any other type of covering on natural nails including finger and toenails. If you have artificial nails, gel coating, etc. that needs to be removed by a nail salon please have this removed prior to surgery or surgery may need to be canceled/ delayed if the surgeon/ anesthesia feels like they are unable to be safely monitored.   Do not shave  48 hours prior to surgery.    Do not bring valuables to the hospital. [place].   Contacts, dentures or bridgework may not be worn into surgery.  DO NOT [place]. PHARMACY WILL DISPENSE MEDICATIONS LISTED ON YOUR MEDICATION LIST TO YOU DURING YOUR ADMISSION [place]!    Patients discharged on the day of surgery will not be allowed to drive home.  Someone NEEDS to stay with you for the first 24 hours after anesthesia.   Special Instructions: Bring a copy of your healthcare power of attorney and living will documents         the day of surgery if you haven't scanned them before.              Please read over the following fact sheets you were given: IF YOU HAVE QUESTIONS ABOUT YOUR PRE-OP INSTRUCTIONS PLEASE CALL [facility] - Preparing for Surgery Before surgery, you can play an important role.  Because skin  is not sterile, your skin needs to be as free of germs as possible.  You can reduce the number of germs on your skin by washing with CHG (chlorahexidine gluconate) soap before surgery.  CHG is an antiseptic cleaner which kills germs and bonds with the skin to continue killing germs even after washing. Please DO NOT use if you have an allergy to CHG or antibacterial soaps.  If your skin becomes reddened/irritated stop using the CHG  and inform your nurse when you arrive at Short Stay. Do not shave (including legs and underarms) for at least 48 hours prior to the first CHG shower.  You may shave your face/neck.  Please follow these instructions carefully:  1.  Shower with CHG Soap the night before surgery and the  morning of surgery.  2.  If you choose to wash your hair, wash your hair first as usual with your normal  shampoo.  3.  After you shampoo, rinse your hair and body thoroughly to remove the shampoo.                             4.  Use CHG as you would any other liquid soap.  You can apply chg directly to the skin and wash.  Gently with a scrungie or clean washcloth.  5.  Apply the CHG Soap to your body ONLY FROM THE NECK DOWN.   Do   not use on face/ open                           Wound or open sores. Avoid contact with eyes, ears mouth and   genitals (private parts).                       Wash face,  Genitals (private parts) with your normal soap.             6.  Wash thoroughly, paying special attention to the area where your    surgery  will be performed.  7.  Thoroughly rinse your body with warm water from the neck down.  8.  DO NOT shower/wash with your normal soap after using and rinsing off the CHG Soap.                9.  Pat yourself dry with a clean towel.            10.  Wear clean pajamas.            11.  Place clean sheets on your bed the night of your first shower and do not  sleep with pets. Day of Surgery : Do not apply any lotions/deodorants the morning of surgery.  Please wear clean clothes to the hospital/surgery center.  FAILURE TO FOLLOW THESE INSTRUCTIONS MAY RESULT IN THE CANCELLATION OF YOUR SURGERY  PATIENT SIGNATURE_________________________________  NURSE SIGNATURE__________________________________  ________________________________________________________________________

## 2021-11-21 ENCOUNTER — Encounter (HOSPITAL_COMMUNITY): Payer: Self-pay | Admitting: Surgery

## 2021-11-22 ENCOUNTER — Encounter (HOSPITAL_COMMUNITY): Payer: Self-pay | Admitting: Surgery

## 2021-11-22 DIAGNOSIS — K8010 Calculus of gallbladder with chronic cholecystitis without obstruction: Secondary | ICD-10-CM | POA: Diagnosis present

## 2021-11-22 NOTE — H&P (Signed)
REFERRING PHYSICIAN: Magod, Glade Stanford, MD  PROVIDER: Shirl Ludington Charlotta Newton, MD   Chief Complaint: New Consultation (Symptomatic cholelithiasis)  History of Present Illness:  Patient is referred by Dr. Clarene Essex for surgical evaluation and management of symptomatic cholelithiasis. Patient has had intermittent episodes of right upper quadrant abdominal pain for years. She is always thought she had gallbladder disease. Family history is notable for gallbladder disease and cholecystectomy in the patient's mother performed by my partner, Dr. Alphonsa Overall. Patient has had reflux symptoms. She complains of intermittent right upper quadrant abdominal pain which is associated with nausea and rarely with emesis. Patient underwent an ultrasound examination on August 06, 2021. This demonstrated hypoechoic foci within the dependent portion of the gallbladder consistent with gallstones. There is also evidence of fatty infiltration of the liver. Prior abdominal surgery includes total vaginal hysterectomy. Patient denies any history of hepatobiliary or pancreatic disease. There is no history of hepatitis or pancreatitis. Patient denies fatty food intolerance. She works at a [employer] in [place].  Review of Systems: A complete review of systems was obtained from the patient. I have reviewed this information and discussed as appropriate with the patient. See HPI as well for other ROS.  Review of Systems  Constitutional: Negative.  HENT: Negative.  Eyes: Negative.  Respiratory: Negative.  Cardiovascular: Negative.  Gastrointestinal: Positive for abdominal pain, nausea and vomiting.  Reflux symptoms  Genitourinary: Negative.  Musculoskeletal: Negative.  Skin: Negative.  Neurological: Negative.  Endo/Heme/Allergies: Negative.  Psychiatric/Behavioral: Negative.    Medical History: Past Medical History:  Diagnosis Date   Anxiety   Diabetes mellitus without complication  (CMS-HCC)   GERD (gastroesophageal reflux disease)   Hyperlipidemia   Thyroid disease   Patient Active Problem List  Diagnosis   Calculus of gallbladder with chronic cholecystitis without obstruction   Past Surgical History:  Procedure Laterality Date   HYSTERECTOMY VAGINAL   knee surgery    Allergies  Allergen Reactions   Cefaclor Rash and Swelling   Current Outpatient Medications on File Prior to Visit  Medication Sig Dispense Refill   omeprazole (PRILOSEC) 40 MG DR capsule 1 capsule 30 minutes before morning meal   aspirin 81 mg Cap 1 tablet   benzonatate (TESSALON) 200 MG capsule TAKE 1 CAPSULE BY MOUTH THREE TIMES DAILY FOR 7 DAYS AS NEEDED FOR COUGH   busPIRone (BUSPAR) 10 MG tablet Take 20 mg by mouth 2 (two) times daily   cholecalciferol (VITAMIN D3) 2,000 unit tablet Take 1 tablet by mouth once daily   citalopram (CELEXA) 20 MG tablet Take 40 mg by mouth once daily   clonazePAM (KLONOPIN) 0.5 MG tablet Take 0.5 mg by mouth 2 (two) times daily   dexmethylphenidate (FOCALIN) 5 MG tablet Take 10 mg by mouth 2 (two) times daily   esomeprazole (NEXIUM) 20 MG DR capsule Take by mouth   estradioL (ESTRACE) 1 MG tablet Take by mouth   ezetimibe (ZETIA) 10 mg tablet Take 10 mg by mouth once daily   fexofenadine (ALLEGRA) 180 MG tablet Take by mouth   JARDIANCE 10 mg tablet   losartan (COZAAR) 25 MG tablet TAKE 1 TABLET BY MOUTH ONCE DAILY FOR KIDNEY PROTECTION AND TO REDUCE PROTEIN   methIMAzole (TAPAZOLE) 10 MG tablet   ondansetron (ZOFRAN-ODT) 4 MG disintegrating tablet DISSOLVE 1 TABLET ON THE TONGUE EVERY 6 HOURS FOR 5 DAYS AS NEEDED FOR NAUSEA OR VOMITING   peg-electrolyte (NULYTELY) solution MIX AND DRINK AS DIRECTED   potassium chloride (  K-TAB) 20 mEq TbER ER tablet TAKE 1 TABLET BY MOUTH TWICE DAILY WITH FOOD FOR 7 DAYS   rosuvastatin (CRESTOR) 40 MG tablet Take 40 mg by mouth at bedtime   SITagliptin-metFORMIN (JANUMET) 50-1,000 mg tablet Take 1 tablet by mouth 2  (two) times daily with meals   tiZANidine (ZANAFLEX) 4 MG tablet   No current facility-administered medications on file prior to visit.   Family History  Problem Relation Age of Onset   High blood pressure (Hypertension) Mother   Hyperlipidemia (Elevated cholesterol) Mother   Coronary Artery Disease (Blocked arteries around heart) Mother   Diabetes Mother    Social History   Tobacco Use  Smoking Status Former   Types: Cigarettes   Quit date: 2007   Years since quitting: 16.3  Smokeless Tobacco Never    Social History   Socioeconomic History   Marital status: Single  Tobacco Use   Smoking status: Former  Types: Cigarettes  Quit date: 2007  Years since quitting: 16.3   Smokeless tobacco: Never  Substance and Sexual Activity   Alcohol use: Yes   Drug use: Never   Objective:   Vitals:   BP: 124/70  Pulse: 96  Temp: 36.6 C (97.8 F)  SpO2: 99%  Weight: 63.5 kg (140 lb)  Height: 154.9 cm ('5\' 1"'$ )   Body mass index is 26.45 kg/m.  Physical Exam   GENERAL APPEARANCE Comfortable, no acute issues Development: normal Gross deformities: none  SKIN Rash, lesions, ulcers: none Induration, erythema: none Nodules: none palpable  EYES Conjunctiva and lids: normal Pupils: equal and reactive  EARS, NOSE, MOUTH, THROAT External ears: no lesion or deformity External nose: no lesion or deformity Hearing: grossly normal  NECK Symmetric: yes Trachea: midline Thyroid: no palpable nodules in the thyroid bed  CHEST Respiratory effort: normal Retraction or accessory muscle use: no Breath sounds: normal bilaterally Rales, rhonchi, wheeze: none  CARDIOVASCULAR Auscultation: regular rhythm, normal rate Murmurs: none Pulses: radial pulse 2+ palpable Lower extremity edema: none  ABDOMEN Soft without distention. No sign of umbilical hernia. Mild tenderness to deep palpation in the right upper quadrant. No palpable masses. No evidence of  hepatosplenomegaly.  GENITOURINARY Not assessed  MUSCULOSKELETAL Station and gait: normal Digits and nails: no clubbing or cyanosis Muscle strength: grossly normal all extremities Range of motion: grossly normal all extremities Deformity: none  LYMPHATIC Cervical: none palpable Supraclavicular: none palpable  PSYCHIATRIC Oriented to person, place, and time: yes Mood and affect: normal for situation Judgment and insight: appropriate for situation  Assessment and Plan:   Calculus of gallbladder with chronic cholecystitis without obstruction  Patient presents on referral from her gastroenterologist for surgical evaluation and management of symptomatic cholelithiasis with probable chronic cholecystitis. Patient is given written literature on gallbladder surgery to review at home.  Patient has had intermittent symptoms likely related to her gallstones for many years. Today we discussed proceeding with laparoscopic cholecystectomy. We discussed the risk and benefits of the procedure including the possibility for conversion to open surgery. We discussed the hospital stay to be anticipated. We discussed her postoperative recovery and time out of work. The patient understands and wishes to proceed with surgery in the near future.  Armandina Gemma, MD [facility] [place] [facility] Office: [phone]

## 2021-11-22 NOTE — Anesthesia Preprocedure Evaluation (Signed)
Anesthesia Evaluation  Patient identified by MRN, date of birth, ID band Patient awake    Reviewed: Allergy & Precautions, NPO status , Patient's Chart, lab work & pertinent test results  Airway Mallampati: II  TM Distance: >3 FB Neck ROM: Full    Dental  (+) Partial Upper   Pulmonary former smoker,    Pulmonary exam normal        Cardiovascular negative cardio ROS Normal cardiovascular exam     Neuro/Psych PSYCHIATRIC DISORDERS Anxiety negative neurological ROS     GI/Hepatic Neg liver ROS, GERD  Medicated and Controlled,  Endo/Other  diabetes, Oral Hypoglycemic Agents  Renal/GU negative Renal ROS     Musculoskeletal negative musculoskeletal ROS (+)   Abdominal   Peds  (+) ADHD Hematology  (+) Blood dyscrasia, anemia ,   Anesthesia Other Findings CHRONIC CHOLECYSTITIS  Reproductive/Obstetrics                            Anesthesia Physical Anesthesia Plan  ASA: 2  Anesthesia Plan: General   Post-op Pain Management:    Induction: Intravenous  PONV Risk Score and Plan: 4 or greater and Ondansetron, Dexamethasone, Midazolam, Scopolamine patch - Pre-op and Treatment may vary due to age or medical condition  Airway Management Planned: Oral ETT  Additional Equipment:   Intra-op Plan:   Post-operative Plan: Extubation in OR  Informed Consent: I have reviewed the patients History and Physical, chart, labs and discussed the procedure including the risks, benefits and alternatives for the proposed anesthesia with the patient or authorized representative who has indicated his/her understanding and acceptance.     Dental advisory given  Plan Discussed with: CRNA  Anesthesia Plan Comments:         Anesthesia Quick Evaluation

## 2021-11-23 ENCOUNTER — Ambulatory Visit (HOSPITAL_COMMUNITY): Payer: Managed Care, Other (non HMO) | Admitting: Anesthesiology

## 2021-11-23 ENCOUNTER — Ambulatory Visit (HOSPITAL_COMMUNITY)
Admission: RE | Admit: 2021-11-23 | Discharge: 2021-11-23 | Disposition: A | Discharge status: Home / Self Care | Payer: Managed Care, Other (non HMO) | Source: Ambulatory Visit | Attending: Surgery | Admitting: Surgery

## 2021-11-23 ENCOUNTER — Other Ambulatory Visit: Payer: Self-pay

## 2021-11-23 ENCOUNTER — Ambulatory Visit (HOSPITAL_BASED_OUTPATIENT_CLINIC_OR_DEPARTMENT_OTHER): Payer: Managed Care, Other (non HMO) | Admitting: Anesthesiology

## 2021-11-23 ENCOUNTER — Encounter (HOSPITAL_COMMUNITY): Payer: Self-pay | Admitting: Surgery

## 2021-11-23 ENCOUNTER — Ambulatory Visit (HOSPITAL_COMMUNITY): Payer: Managed Care, Other (non HMO)

## 2021-11-23 ENCOUNTER — Encounter (HOSPITAL_COMMUNITY)
Admission: RE | Disposition: A | Discharge status: Home / Self Care | Payer: Self-pay | Source: Ambulatory Visit | Attending: Surgery

## 2021-11-23 DIAGNOSIS — K219 Gastro-esophageal reflux disease without esophagitis: Secondary | ICD-10-CM | POA: Insufficient documentation

## 2021-11-23 DIAGNOSIS — F909 Attention-deficit hyperactivity disorder, unspecified type: Secondary | ICD-10-CM | POA: Insufficient documentation

## 2021-11-23 DIAGNOSIS — Z87891 Personal history of nicotine dependence: Secondary | ICD-10-CM | POA: Diagnosis not present

## 2021-11-23 DIAGNOSIS — Z01818 Encounter for other preprocedural examination: Secondary | ICD-10-CM

## 2021-11-23 DIAGNOSIS — K8010 Calculus of gallbladder with chronic cholecystitis without obstruction: Secondary | ICD-10-CM | POA: Insufficient documentation

## 2021-11-23 DIAGNOSIS — E119 Type 2 diabetes mellitus without complications: Secondary | ICD-10-CM | POA: Insufficient documentation

## 2021-11-23 DIAGNOSIS — Z7984 Long term (current) use of oral hypoglycemic drugs: Secondary | ICD-10-CM | POA: Insufficient documentation

## 2021-11-23 DIAGNOSIS — F419 Anxiety disorder, unspecified: Secondary | ICD-10-CM | POA: Insufficient documentation

## 2021-11-23 DIAGNOSIS — I2510 Atherosclerotic heart disease of native coronary artery without angina pectoris: Secondary | ICD-10-CM

## 2021-11-23 HISTORY — PX: CHOLECYSTECTOMY: SHX55

## 2021-11-23 LAB — GLUCOSE, CAPILLARY
Glucose-Capillary: 138 mg/dL — ABNORMAL HIGH (ref 70–99)
Glucose-Capillary: 177 mg/dL — ABNORMAL HIGH (ref 70–99)

## 2021-11-23 SURGERY — LAPAROSCOPIC CHOLECYSTECTOMY WITH INTRAOPERATIVE CHOLANGIOGRAM
Anesthesia: General | Site: Abdomen

## 2021-11-23 MED ORDER — MIDAZOLAM HCL 5 MG/5ML IJ SOLN
INTRAMUSCULAR | Status: DC | PRN
  Administered 2021-11-23: 1 mg via INTRAVENOUS

## 2021-11-23 MED ORDER — PHENYLEPHRINE 80 MCG/ML (10ML) SYRINGE FOR IV PUSH (FOR BLOOD PRESSURE SUPPORT)
PREFILLED_SYRINGE | INTRAVENOUS | Status: DC | PRN
  Administered 2021-11-23 (×5): 80 ug via INTRAVENOUS

## 2021-11-23 MED ORDER — PHENYLEPHRINE HCL (PRESSORS) 10 MG/ML IV SOLN
INTRAVENOUS | Status: AC
  Filled 2021-11-23: qty 1

## 2021-11-23 MED ORDER — DEXAMETHASONE SODIUM PHOSPHATE 10 MG/ML IJ SOLN
INTRAMUSCULAR | Status: AC
  Filled 2021-11-23: qty 2

## 2021-11-23 MED ORDER — KETOROLAC TROMETHAMINE 30 MG/ML IJ SOLN
30.0000 mg | Freq: Once | INTRAMUSCULAR | Status: AC | PRN
  Administered 2021-11-23: 30 mg via INTRAVENOUS

## 2021-11-23 MED ORDER — 0.9 % SODIUM CHLORIDE (POUR BTL) OPTIME
TOPICAL | Status: DC | PRN
  Administered 2021-11-23: 1000 mL

## 2021-11-23 MED ORDER — SCOPOLAMINE 1 MG/3DAYS TD PT72
1.0000 | MEDICATED_PATCH | TRANSDERMAL | Status: DC
  Administered 2021-11-23: 1.5 mg via TRANSDERMAL
  Filled 2021-11-23: qty 1

## 2021-11-23 MED ORDER — ORAL CARE MOUTH RINSE
15.0000 mL | Freq: Once | OROMUCOSAL | Status: AC
Start: 2021-11-23 — End: 2021-11-23

## 2021-11-23 MED ORDER — LIDOCAINE 2% (20 MG/ML) 5 ML SYRINGE
INTRAMUSCULAR | Status: DC | PRN
  Administered 2021-11-23: 80 mg via INTRAVENOUS

## 2021-11-23 MED ORDER — BUPIVACAINE-EPINEPHRINE (PF) 0.5% -1:200000 IJ SOLN
INTRAMUSCULAR | Status: AC
Start: 2021-11-23 — End: ?
  Filled 2021-11-23: qty 30

## 2021-11-23 MED ORDER — CHLORHEXIDINE GLUCONATE 0.12 % MT SOLN
15.0000 mL | Freq: Once | OROMUCOSAL | Status: AC
  Administered 2021-11-23: 15 mL via OROMUCOSAL

## 2021-11-23 MED ORDER — FENTANYL CITRATE (PF) 100 MCG/2ML IJ SOLN
INTRAMUSCULAR | Status: AC
  Filled 2021-11-23: qty 2

## 2021-11-23 MED ORDER — LACTATED RINGERS IR SOLN
Status: DC | PRN
  Administered 2021-11-23: 1000 mL

## 2021-11-23 MED ORDER — MIDAZOLAM HCL 2 MG/2ML IJ SOLN
INTRAMUSCULAR | Status: AC
  Filled 2021-11-23: qty 2

## 2021-11-23 MED ORDER — CIPROFLOXACIN IN D5W 400 MG/200ML IV SOLN
400.0000 mg | INTRAVENOUS | Status: AC
  Administered 2021-11-23: 400 mg via INTRAVENOUS
  Filled 2021-11-23: qty 200

## 2021-11-23 MED ORDER — ROCURONIUM BROMIDE 10 MG/ML (PF) SYRINGE
PREFILLED_SYRINGE | INTRAVENOUS | Status: DC | PRN
  Administered 2021-11-23: 70 mg via INTRAVENOUS

## 2021-11-23 MED ORDER — LIDOCAINE HCL (PF) 2 % IJ SOLN
INTRAMUSCULAR | Status: AC
  Filled 2021-11-23: qty 10

## 2021-11-23 MED ORDER — SUGAMMADEX SODIUM 200 MG/2ML IV SOLN
INTRAVENOUS | Status: DC | PRN
  Administered 2021-11-23: 250 mg via INTRAVENOUS

## 2021-11-23 MED ORDER — OXYCODONE HCL 5 MG PO TABS
5.0000 mg | ORAL_TABLET | Freq: Once | ORAL | Status: AC | PRN
  Administered 2021-11-23: 5 mg via ORAL

## 2021-11-23 MED ORDER — OXYCODONE HCL 5 MG/5ML PO SOLN: 5.0000 mg | Freq: Once | ORAL | Status: AC | PRN

## 2021-11-23 MED ORDER — TRAMADOL HCL 50 MG PO TABS: 50.0000 mg | ORAL_TABLET | Freq: Four times a day (QID) | ORAL | 0 refills | Status: DC | PRN

## 2021-11-23 MED ORDER — CHLORHEXIDINE GLUCONATE CLOTH 2 % EX PADS: 6.0000 | MEDICATED_PAD | Freq: Once | CUTANEOUS | Status: DC

## 2021-11-23 MED ORDER — BUPIVACAINE-EPINEPHRINE 0.5% -1:200000 IJ SOLN
INTRAMUSCULAR | Status: DC | PRN
  Administered 2021-11-23: 30 mL

## 2021-11-23 MED ORDER — LACTATED RINGERS IV SOLN: INTRAVENOUS | Status: DC

## 2021-11-23 MED ORDER — ROCURONIUM BROMIDE 10 MG/ML (PF) SYRINGE
PREFILLED_SYRINGE | INTRAVENOUS | Status: AC
  Filled 2021-11-23: qty 20

## 2021-11-23 MED ORDER — DEXAMETHASONE SODIUM PHOSPHATE 10 MG/ML IJ SOLN
INTRAMUSCULAR | Status: DC | PRN
  Administered 2021-11-23: 4 mg via INTRAVENOUS

## 2021-11-23 MED ORDER — KETOROLAC TROMETHAMINE 30 MG/ML IJ SOLN
INTRAMUSCULAR | Status: AC
  Filled 2021-11-23: qty 1

## 2021-11-23 MED ORDER — IOHEXOL 300 MG/ML  SOLN
INTRAMUSCULAR | Status: DC | PRN
  Administered 2021-11-23: 10 mL

## 2021-11-23 MED ORDER — ACETAMINOPHEN 500 MG PO TABS
1000.0000 mg | ORAL_TABLET | Freq: Once | ORAL | Status: AC
  Administered 2021-11-23: 1000 mg via ORAL
  Filled 2021-11-23: qty 2

## 2021-11-23 MED ORDER — PROPOFOL 10 MG/ML IV BOLUS
INTRAVENOUS | Status: AC
  Filled 2021-11-23: qty 20

## 2021-11-23 MED ORDER — OXYCODONE HCL 5 MG PO TABS
ORAL_TABLET | ORAL | Status: AC
  Filled 2021-11-23: qty 1

## 2021-11-23 MED ORDER — PROPOFOL 10 MG/ML IV BOLUS
INTRAVENOUS | Status: AC
Start: 2021-11-23 — End: ?
  Filled 2021-11-23: qty 20

## 2021-11-23 MED ORDER — FENTANYL CITRATE PF 50 MCG/ML IJ SOSY: 25.0000 ug | PREFILLED_SYRINGE | INTRAMUSCULAR | Status: DC | PRN

## 2021-11-23 MED ORDER — AMISULPRIDE (ANTIEMETIC) 5 MG/2ML IV SOLN: 10.0000 mg | Freq: Once | INTRAVENOUS | Status: DC | PRN

## 2021-11-23 MED ORDER — ONDANSETRON HCL 4 MG/2ML IJ SOLN
INTRAMUSCULAR | Status: AC
  Filled 2021-11-23: qty 4

## 2021-11-23 MED ORDER — DEXMEDETOMIDINE (PRECEDEX) IN NS 20 MCG/5ML (4 MCG/ML) IV SYRINGE
PREFILLED_SYRINGE | INTRAVENOUS | Status: AC
  Filled 2021-11-23: qty 5

## 2021-11-23 MED ORDER — FENTANYL CITRATE (PF) 100 MCG/2ML IJ SOLN
INTRAMUSCULAR | Status: DC | PRN
Start: 2021-11-23 — End: 2021-11-23
  Administered 2021-11-23 (×2): 50 ug via INTRAVENOUS

## 2021-11-23 MED ORDER — PROPOFOL 10 MG/ML IV BOLUS
INTRAVENOUS | Status: DC | PRN
  Administered 2021-11-23: 140 mg via INTRAVENOUS

## 2021-11-23 MED ORDER — ONDANSETRON HCL 4 MG/2ML IJ SOLN
INTRAMUSCULAR | Status: DC | PRN
  Administered 2021-11-23: 4 mg via INTRAVENOUS

## 2021-11-23 SURGICAL SUPPLY — 48 items
ADH SKN CLS APL DERMABOND .7 (GAUZE/BANDAGES/DRESSINGS) ×1
APL PRP STRL LF DISP 70% ISPRP (MISCELLANEOUS) ×1
APPLIER CLIP 5 13 M/L LIGAMAX5 (MISCELLANEOUS) ×2
APPLIER CLIP ROT 10 11.4 M/L (STAPLE)
APR CLP MED LRG 11.4X10 (STAPLE)
APR CLP MED LRG 5 ANG JAW (MISCELLANEOUS) ×1
BAG COUNTER SPONGE SURGICOUNT (BAG) IMPLANT
BAG RETRIEVAL 10 (BASKET) ×1
BAG SPNG CNTER NS LX DISP (BAG)
CABLE HIGH FREQUENCY MONO STRZ (ELECTRODE) ×2 IMPLANT
CHLORAPREP W/TINT 26 (MISCELLANEOUS) ×3 IMPLANT
CLIP APPLIE 5 13 M/L LIGAMAX5 (MISCELLANEOUS) IMPLANT
CLIP APPLIE ROT 10 11.4 M/L (STAPLE) ×1 IMPLANT
COVER MAYO STAND XLG (MISCELLANEOUS) ×2 IMPLANT
COVER SURGICAL LIGHT HANDLE (MISCELLANEOUS) ×2 IMPLANT
DERMABOND ADVANCED (GAUZE/BANDAGES/DRESSINGS) ×1
DERMABOND ADVANCED .7 DNX12 (GAUZE/BANDAGES/DRESSINGS) IMPLANT
DRAPE C-ARM 42X120 X-RAY (DRAPES) ×2 IMPLANT
ELECT REM PT RETURN 15FT ADLT (MISCELLANEOUS) ×2 IMPLANT
GAUZE SPONGE 2X2 8PLY STRL LF (GAUZE/BANDAGES/DRESSINGS) ×1 IMPLANT
GLOVE SURG ORTHO 8.0 STRL STRW (GLOVE) ×2 IMPLANT
GLOVE SURG SYN 7.5  E (GLOVE) ×4
GLOVE SURG SYN 7.5 E (GLOVE) ×2 IMPLANT
GLOVE SURG SYN 7.5 PF PI (GLOVE) ×2 IMPLANT
GOWN STRL REUS W/ TWL XL LVL3 (GOWN DISPOSABLE) ×2 IMPLANT
GOWN STRL REUS W/TWL XL LVL3 (GOWN DISPOSABLE) ×4
HEMOSTAT SURGICEL 4X8 (HEMOSTASIS) IMPLANT
IRRIG SUCT STRYKERFLOW 2 WTIP (MISCELLANEOUS) ×2
IRRIGATION SUCT STRKRFLW 2 WTP (MISCELLANEOUS) ×1 IMPLANT
KIT BASIN OR (CUSTOM PROCEDURE TRAY) ×2 IMPLANT
KIT TURNOVER KIT A (KITS) ×1 IMPLANT
PENCIL SMOKE EVACUATOR (MISCELLANEOUS) IMPLANT
SCISSORS LAP 5X35 DISP (ENDOMECHANICALS) ×2 IMPLANT
SET CHOLANGIOGRAPH MIX (MISCELLANEOUS) ×2 IMPLANT
SET TUBE SMOKE EVAC HIGH FLOW (TUBING) IMPLANT
SLEEVE Z-THREAD 5X100MM (TROCAR) ×2 IMPLANT
SPIKE FLUID TRANSFER (MISCELLANEOUS) ×1 IMPLANT
SPONGE GAUZE 2X2 STER 10/PKG (GAUZE/BANDAGES/DRESSINGS)
STRIP CLOSURE SKIN 1/2X4 (GAUZE/BANDAGES/DRESSINGS) IMPLANT
SUT MNCRL AB 4-0 PS2 18 (SUTURE) ×2 IMPLANT
SYS BAG RETRIEVAL 10MM (BASKET) ×1
SYSTEM BAG RETRIEVAL 10MM (BASKET) ×1 IMPLANT
TOWEL OR 17X26 10 PK STRL BLUE (TOWEL DISPOSABLE) ×2 IMPLANT
TOWEL OR NON WOVEN STRL DISP B (DISPOSABLE) ×2 IMPLANT
TRAY LAPAROSCOPIC (CUSTOM PROCEDURE TRAY) ×2 IMPLANT
TROCAR 11X100 Z THREAD (TROCAR) ×2 IMPLANT
TROCAR BALLN 12MMX100 BLUNT (TROCAR) ×2 IMPLANT
TROCAR Z-THREAD OPTICAL 5X100M (TROCAR) ×2 IMPLANT

## 2021-11-23 NOTE — Anesthesia Postprocedure Evaluation (Signed)
Anesthesia Post Note  Patient: Janet Kelley  Procedure(s) Performed: LAPAROSCOPIC CHOLECYSTECTOMY WITH INTRAOPERATIVE CHOLANGIOGRAM (Abdomen)     Patient location during evaluation: PACU Anesthesia Type: General Level of consciousness: awake Pain management: pain level controlled Vital Signs Assessment: post-procedure vital signs reviewed and stable Respiratory status: spontaneous breathing, nonlabored ventilation, respiratory function stable and patient connected to nasal cannula oxygen Cardiovascular status: blood pressure returned to baseline and stable Postop Assessment: no apparent nausea or vomiting Anesthetic complications: no   No notable events documented.  Last Vitals:  Vitals:   11/23/21 0845 11/23/21 0915  BP: (!) 156/89 108/61  Pulse: 81 76  Resp: 19 15  Temp: 36.9 C   SpO2: 100% 98%    Last Pain:  Vitals:   11/23/21 0945  TempSrc:   PainSc: 0-No pain                 Quincee Gittens P Tyffani Foglesong

## 2021-11-23 NOTE — Discharge Instructions (Signed)
[facility]  LAPAROSCOPIC SURGERY:  POST-OP INSTRUCTIONS  Always review your discharge instruction sheet given to you by the facility where your surgery was performed.  A prescription for pain medication may be given to you upon discharge.  Take your pain medication as prescribed.  If narcotic pain medicine is not needed, then you may take acetaminophen (Tylenol) or ibuprofen (Advil) as needed.  Take your usually prescribed medications unless otherwise directed.  If you need a refill on your pain medication, please contact your pharmacy.  They will contact our office to request authorization. Prescriptions will not be filled after 5 P.M. or on weekends.  You should follow a light diet the first few days after arrival home, such as soup and crackers or toast.  Be sure to include plenty of fluids daily.  Most patients will experience some swelling and bruising in the area of the incisions.  Ice packs will help.  Swelling and bruising can take several days to resolve.   It is common to experience some constipation after surgery.  Increasing fluid intake and taking a stool softener (such as Colace) will usually help or prevent this problem from occurring.  A mild laxative (Milk of Magnesia or Miralax) should be taken according to package instructions if there has been no bowel movement after 48 hours.  You will likely have Dermabond (topical glue) over your incisions.  This seals the incisions and allows you to bathe and shower at any time after your surgery.  Glue should remain in place for up to 10 days.  It may be removed after 10 days by pealing off the Dermabond material or using Vaseline or naval jelly to remove.  If you have steri-strips over your incisions, you may remove the gauze bandage on the second day after surgery, and you may shower at that time.  Leave your steri-strips (small skin tapes) in place directly over the incision.  These strips should remain on the  skin for 5-7 days and then be removed.  You may get them wet in the shower and pat them dry.  Any sutures or staples will be removed at the office during your follow-up visit.  ACTIVITIES:  You may resume regular (light) daily activities beginning the next day - such as daily self-care, walking, climbing stairs - gradually increasing activities as tolerated.  You may have sexual intercourse when it is comfortable.  Refrain from any heavy lifting or straining until approved by your doctor.  You may drive when you are no longer taking prescription pain medication, when you can comfortably wear a seatbelt, and when you can safely maneuver your car and apply brakes.  You should see your doctor in the office for a follow-up appointment approximately 2-3 weeks after your surgery.  Make sure that you call for this appointment within a day or two after you arrive home to insure a convenient appointment time.  WHEN TO CALL YOUR DOCTOR: Fever over 101.0 Inability to urinate Continued bleeding from incision Increased pain, redness, or drainage from the incision Increasing abdominal pain  The clinic staff is available to answer your questions during regular business hours.  Please don't hesitate to call and ask to speak to one of the nurses for clinical concerns.  If you have a medical emergency, go to the nearest emergency room or call 911.  A surgeon from [facility] is always on call for the hospital.  Jeremias Broyhill, MD [facility] Office: [phone] Toll Free:    [phone] FAX [phone]  Website: [email or web address] 

## 2021-11-23 NOTE — Op Note (Signed)
Procedure Note  Pre-operative Diagnosis:  chronic cholecystitis, cholelithiasis  Post-operative Diagnosis:  same  Surgeon:  Armandina Gemma, MD  Assistant:  none   Procedure:  Laparoscopic cholecystectomy with intra-operative cholangiography  Anesthesia:  General  Estimated Blood Loss:  minimal  Drains: none         Specimen: gallbladder to pathology  Indications:  Patient is referred by Dr. Clarene Essex for surgical evaluation and management of symptomatic cholelithiasis. Patient has had intermittent episodes of right upper quadrant abdominal pain for years. She is always thought she had gallbladder disease. Family history is notable for gallbladder disease and cholecystectomy in the patient's mother performed by my partner, Dr. Alphonsa Overall. Patient has had reflux symptoms. She complains of intermittent right upper quadrant abdominal pain which is associated with nausea and rarely with emesis. Patient underwent an ultrasound examination on August 06, 2021. This demonstrated hypoechoic foci within the dependent portion of the gallbladder consistent with gallstones. There is also evidence of fatty infiltration of the liver. Prior abdominal surgery includes total vaginal hysterectomy. Patient denies any history of hepatobiliary or pancreatic disease. There is no history of hepatitis or pancreatitis. Patient denies fatty food intolerance. 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  Procedure description: The patient was seen in the pre-op holding area. The risks, benefits, complications, treatment options, and expected outcomes were previously discussed with the patient. The patient agreed with the proposed plan and has signed the informed consent form.  The patient was transported to operating room #4 at the [facility]. The patient was placed in the supine position on the operating room table. Following induction of general anesthesia, the abdomen was prepped and draped  in the usual aseptic fashion.  An incision was made in the skin near the umbilicus. The midline fascia was incised and the peritoneal cavity was entered and a Hasson cannula was introduced under direct vision. The cannula was secured with a 0-Vicryl pursestring suture. Pneumoperitoneum was established with carbon dioxide. Additional cannulae were introduced under direct vision along the right costal margin in the midline, mid-clavicular line, and anterior axillary line.   The gallbladder was identified and the fundus grasped and retracted cephalad. Adhesions were taken down bluntly and the electrocautery was utilized as needed, taking care not to involve any adjacent structures. The infundibulum was grasped and retracted laterally, exposing the peritoneum overlying the triangle of Calot. The peritoneum was incised and structures exposed with blunt dissection. The cystic duct was clearly identified, bluntly dissected circumferentially, and clipped at the neck of the gallbladder.  An incision was made in the cystic duct and the cholangiogram catheter introduced. The catheter was secured using an ligaclip.  Real-time cholangiography was performed using C-arm fluoroscopy.  There was rapid filling of a normal caliber common bile duct.  There was reflux of contrast into the left and right hepatic ductal systems.  There was free flow distally into the duodenum without filling defect or obstruction.  The catheter was removed from the peritoneal cavity.  The cystic duct was then ligated with ligaclips and divided. The cystic artery was identified, dissected circumferentially, ligated with ligaclips, and divided.  The gallbladder was dissected away from the gallbladder bed using the electrocautery for hemostasis. The gallbladder was completely removed from the liver and placed into an endocatch bag. The gallbladder was removed in the endocatch bag through the umbilical port site and submitted to pathology for  review.  The right upper quadrant was irrigated and the gallbladder bed was  inspected. Hemostasis was achieved with the electrocautery.  Cannulae were removed under direct vision and good hemostasis was noted. Pneumoperitoneum was released and the majority of the carbon dioxide evacuated. The umbilical wound was irrigated and the fascia was then closed with the pursestring suture.  Local anesthetic was infiltrated at all port sites. Skin incisions were closed with 4-0 Monocril subcuticular sutures and Dermabond was applied.  Instrument, sponge, and needle counts were correct at the conclusion of the case.  The patient was awakened from anesthesia and brought to the recovery room in stable condition.  The patient tolerated the procedure well.   Armandina Gemma, MD [facility] Office: [phone]

## 2021-11-23 NOTE — Anesthesia Procedure Notes (Signed)
Procedure Name: Intubation Date/Time: 11/23/2021 7:39 AM  Performed by: Lavina Hamman, CRNAPre-anesthesia Checklist: Patient identified, Emergency Drugs available, Suction available, Patient being monitored and Timeout performed Patient Re-evaluated:Patient Re-evaluated prior to induction Oxygen Delivery Method: Circle system utilized Preoxygenation: Pre-oxygenation with 100% oxygen Induction Type: IV induction Ventilation: Mask ventilation without difficulty Laryngoscope Size: Mac and 3 Grade View: Grade I Tube type: Oral Tube size: 7.0 mm Number of attempts: 1 Airway Equipment and Method: Stylet Placement Confirmation: ETT inserted through vocal cords under direct vision, positive ETCO2, CO2 detector and breath sounds checked- equal and bilateral Secured at: 21 cm Tube secured with: Tape Dental Injury: Teeth and Oropharynx as per pre-operative assessment  Comments: ATOI

## 2021-11-23 NOTE — Transfer of Care (Signed)
Immediate Anesthesia Transfer of Care Note  Patient: Janet Kelley  Procedure(s) Performed: Procedure(s): LAPAROSCOPIC CHOLECYSTECTOMY WITH INTRAOPERATIVE CHOLANGIOGRAM (N/A)  Patient Location: PACU  Anesthesia Type:General  Level of Consciousness:  sedated, patient cooperative and responds to stimulation  Airway & Oxygen Therapy:Patient Spontanous Breathing and Patient connected to face mask oxgen  Post-op Assessment:  Report given to PACU RN and Post -op Vital signs reviewed and stable  Post vital signs:  Reviewed and stable  Last Vitals:  Vitals:   11/23/21 0610 11/23/21 0845  BP: 115/72 (!) 156/89  Pulse: 81 81  Resp: 16 19  Temp: 36.7 C   SpO2: 78% 242%    Complications: No apparent anesthesia complications

## 2021-11-23 NOTE — Interval H&P Note (Signed)
History and Physical Interval Note:  11/23/2021 6:57 AM  Janet Kelley  has presented today for surgery, with the diagnosis of CHRONIC CHOLECYSTITIS.  The various methods of treatment have been discussed with the patient and family. After consideration of risks, benefits and other options for treatment, the patient has consented to    Procedure(s): LAPAROSCOPIC CHOLECYSTECTOMY WITH INTRAOPERATIVE CHOLANGIOGRAM (N/A) as a surgical intervention.    The patient's history has been reviewed, patient examined, no change in status, stable for surgery.  I have reviewed the patient's chart and labs.  Questions were answered to the patient's satisfaction.    Armandina Gemma, [facility] [place] [facility] Office: [place]

## 2021-11-24 ENCOUNTER — Encounter (HOSPITAL_COMMUNITY): Payer: Self-pay | Admitting: Surgery

## 2021-11-24 LAB — SURGICAL PATHOLOGY

## 2021-12-15 DIAGNOSIS — Z9049 Acquired absence of other specified parts of digestive tract: Secondary | ICD-10-CM | POA: Insufficient documentation

## 2021-12-22 ENCOUNTER — Ambulatory Visit: Payer: Managed Care, Other (non HMO) | Admitting: Endocrinology

## 2022-04-22 ENCOUNTER — Ambulatory Visit: Payer: Managed Care, Other (non HMO) | Admitting: Internal Medicine

## 2022-04-22 ENCOUNTER — Encounter: Payer: Self-pay | Admitting: Internal Medicine

## 2022-04-22 VITALS — BP 128/82 | Ht 60.25 in | Wt 149.4 lb

## 2022-04-22 DIAGNOSIS — E119 Type 2 diabetes mellitus without complications: Secondary | ICD-10-CM | POA: Diagnosis not present

## 2022-04-22 DIAGNOSIS — E0590 Thyrotoxicosis, unspecified without thyrotoxic crisis or storm: Secondary | ICD-10-CM | POA: Diagnosis not present

## 2022-04-22 LAB — POCT GLYCOSYLATED HEMOGLOBIN (HGB A1C): Hemoglobin A1C: 6.1 % — AB (ref 4.0–5.6)

## 2022-04-22 MED ORDER — FLUCONAZOLE 150 MG PO TABS: 150.0000 mg | ORAL_TABLET | Freq: Once | ORAL | 0 refills | Status: AC

## 2022-04-22 MED ORDER — JANUMET 50-1000 MG PO TABS: 1.0000 | ORAL_TABLET | Freq: Two times a day (BID) | ORAL | 3 refills | Status: DC

## 2022-04-22 MED ORDER — JANUMET 50-1000 MG PO TABS
1.0000 | ORAL_TABLET | Freq: Two times a day (BID) | ORAL | 3 refills | Status: DC
Start: 2022-04-22 — End: 2022-12-10

## 2022-04-22 MED ORDER — EMPAGLIFLOZIN 10 MG PO TABS: 10.0000 mg | ORAL_TABLET | Freq: Every day | ORAL | 3 refills | Status: DC

## 2022-04-22 NOTE — Progress Notes (Signed)
Patient ID: Janet Kelley, female   DOB: 06/28/1960, 61 y.o.   MRN: [id number]  HPI: Farhana M Kelley is a 61 y.o.-year-old female, returning for follow-up for DM2, dx in 2009, non-insulin-dependent, controlled, without long-term complications and also, thyroid nodule and thyrotoxicosis. Pt. previously saw Dr. Loanne Drilling, last visit 5 months ago.  Patient recently had laparoscopic cholecystectomy by Dr. Harlow Asa.  She feels well, without complaints today.  DM2: Reviewed HbA1c: Lab Results  Component Value Date   HGBA1C 6.1 (H) 11/11/2021   HGBA1C 6.2 09/21/2021   HGBA1C 6.4 (A) 06/23/2021   HGBA1C 6.5 (A) 12/19/2020   HGBA1C 6.8 (A) 09/16/2020   HGBA1C 6.3 (A) 06/17/2020   HGBA1C 6.6 (A) 04/15/2020   HGBA1C 6.3 (A) 02/19/2020   HGBA1C 6.3 (A) 12/14/2019   HGBA1C 6.7 (A) 10/12/2019   Pt is on a regimen of: - Metformin ER-Sitagliptin 1000-50 mg 2x a daily - Jardiance 10 mg before breakfast She previously tried Byetta, Victoza, Trulicity and they cause nausea. Jardiance caused vaginitis in the past, not tolerated well.  Pt checks her sugars 2-3x a week and they are: - am: 110-120 - 2h after b'fast: n/c - before lunch: n/c - 2h after lunch: n/c - before dinner: n/c - 2h after dinner: n/c - bedtime: n/c - nighttime: n/c Lowest sugar was 90s; she has hypoglycemia awareness at 70.  Highest sugar was 200.  Glucometer:Freestyle  - no CKD, last BUN/creatinine:  Lab Results  Component Value Date   BUN 12 11/11/2021   BUN 14 12/19/2020   CREATININE 0.69 11/11/2021   CREATININE 0.86 12/19/2020  07/28/2021: ACR 90.9 She is on Cozaar 25 mg.  -+ HL; last set of lipids: 07/28/2021: 139/68/49/77 No results found for: "CHOL", "HDL", "LDLCALC", "LDLDIRECT", "TRIG", "CHOLHDL" On Crestor 40 mg daily and Zetia 10 mg daily.  - last eye exam was in ~02/2022. No DR reportedly.   - no numbness and tingling in her feet. Last foot exam 09/2020.  Thyroid nodule:  Patient was diagnosed  with a thyroid nodule in 1995, and a biopsy returned benign in the same year, reportedly.  Pt denies: - feeling nodules in neck - hoarseness - dysphagia - choking - SOB with lying down  Thyrotoxicosis:  She was diagnosed with thyrotoxicosis in 2021.  She did not have imaging for this.  She is currently on methimazole 10 mg daily.  She was previously on metoprolol, now off.  Reviewed her TFTs: Lab Results  Component Value Date   TSH 2.91 09/21/2021   TSH 3.12 06/23/2021   TSH 2.54 03/05/2021   TSH 1.99 12/19/2020   TSH 4.50 06/17/2020   TSH 0.57 04/15/2020   TSH 18.81 (H) 02/19/2020   TSH 8.64 (H) 12/14/2019   TSH <0.01 (L) 10/12/2019   TSH <0.01 (L) 08/10/2019   TSH <0.01 (L) 07/09/2019   TSH <0.010 (L) 06/12/2019   TSH 1.059 05/09/2015   FREET4 0.94 09/21/2021   FREET4 1.05 06/23/2021   FREET4 0.94 03/05/2021   FREET4 0.91 12/19/2020   FREET4 0.84 06/17/2020   FREET4 0.94 04/15/2020   FREET4 0.37 (L) 02/19/2020   FREET4 0.49 (L) 12/14/2019   FREET4 1.55 10/12/2019   FREET4 0.75 08/10/2019   FREET4 1.51 07/09/2019   FREET4 4.84 (H) 06/12/2019   No FH of thyroid cancer. No h/o radiation tx to head or neck. No recent contrast studies. No herbal supplements. No Biotin use. No recent steroids use.   She has a history of vitamin D deficiency -  on 2000 units, fibroids, endometriosis.  ROS: + see HPI No increased urination, blurry vision, nausea, chest pain.  Past Medical History:  Diagnosis Date   ADHD (attention deficit hyperactivity disorder)    Anxiety    Cervical dysplasia    Diabetes mellitus    Type 2   Elevated cholesterol    Endometriosis    Fibroid    GERD (gastroesophageal reflux disease)    Hyperthyroidism    Vitamin D deficiency 05/2013   Past Surgical History:  Procedure Laterality Date   CERVICAL DISC SURGERY     CHOLECYSTECTOMY N/A 11/23/2021   Procedure: LAPAROSCOPIC CHOLECYSTECTOMY WITH INTRAOPERATIVE CHOLANGIOGRAM;  Surgeon: Armandina Gemma, MD;  Location: [facility];  Service: General;  Laterality: N/A;   COLPOSCOPY     GYNECOLOGIC CRYOSURGERY     KNEE SURGERY Left    Arthroscopic   PELVIC LAPAROSCOPY     DL laser endometriosis   VAGINAL HYSTERECTOMY  06/08/2007   [facility]   Social History   Socioeconomic History   Marital status: Single    Spouse name: Not on file   Number of children: Not on file   Years of education: Not on file   Highest education level: Not on file  Occupational History   Not on file  Tobacco Use   Smoking status: Former    Types: Cigarettes    Quit date: 03/16/2006    Years since quitting: 16.1   Smokeless tobacco: Never  Vaping Use   Vaping Use: Never used  Substance and Sexual Activity   Alcohol use: Yes    Alcohol/week: 2.0 standard drinks of alcohol    Types: 2 Standard drinks or equivalent per week    Comment: occasiaonal   Drug use: No   Sexual activity: Yes    Partners: Male    Birth control/protection: Surgical    Comment: 1st intercourse- 18, partners- greater than 5  Other Topics Concern   Not on file  Social History Narrative   Not on file   Social Determinants of Health   Financial Resource Strain: Not on file  Food Insecurity: Not on file  Transportation Needs: Not on file  Physical Activity: Not on file  Stress: Not on file  Social Connections: Not on file  Intimate Partner Violence: Not on file   Current Outpatient Medications on File Prior to Visit  Medication Sig Dispense Refill   acetaminophen (TYLENOL) 500 MG tablet Take 1,000 mg by mouth every 6 (six) hours as needed (pain).     aspirin EC 81 MG tablet Take 81 mg by mouth daily.     busPIRone (BUSPAR) 10 MG tablet Take 20 mg by mouth 2 (two) times daily.     Cholecalciferol (VITAMIN D3 PO) Take 1 tablet by mouth daily.     citalopram (CELEXA) 40 MG tablet Take 40 mg by mouth every evening.     clonazePAM (KLONOPIN) 0.5 MG tablet Take 0.5 mg by mouth in the morning and at bedtime.     empagliflozin  (JARDIANCE) 10 MG TABS tablet Take 1 tablet (10 mg total) by mouth daily. (Patient taking differently: Take 10 mg by mouth every evening.) 90 tablet 3   ezetimibe (ZETIA) 10 MG tablet Take 10 mg by mouth at bedtime.      fexofenadine (ALLEGRA) 180 MG tablet Take 180 mg by mouth in the morning.     JANUMET 50-1000 MG tablet Take 1 tablet by mouth 2 (two) times daily.     lisdexamfetamine (VYVANSE) 70  MG capsule Take 70 mg by mouth in the morning.     loperamide (IMODIUM) 2 MG capsule Take 2-4 mg by mouth 4 (four) times daily as needed for diarrhea or loose stools.     losartan (COZAAR) 25 MG tablet Take 25 mg by mouth in the morning.     methimazole (TAPAZOLE) 10 MG tablet Take 1 tablet (10 mg total) by mouth daily. 90 tablet 3   Multiple Vitamin (MULTIVITAMIN WITH MINERALS) TABS tablet Take 1 tablet by mouth daily.     omeprazole (PRILOSEC) 40 MG capsule Take 40 mg by mouth in the morning and at bedtime.     ondansetron (ZOFRAN-ODT) 4 MG disintegrating tablet Take 4 mg by mouth every 8 (eight) hours as needed for nausea or vomiting.     potassium chloride (KLOR-CON) 10 MEQ tablet Take 10 mEq by mouth every evening.     rosuvastatin (CRESTOR) 40 MG tablet Take 40 mg by mouth at bedtime.      SitaGLIPtin-MetFORMIN HCl (JANUMET XR) [phone] MG TB24 Take 1 tablet by mouth daily. (Patient not taking: Reported on 11/04/2021) 90 tablet 3   tiZANidine (ZANAFLEX) 4 MG tablet Take 1 tablet (4 mg total) by mouth every 6 (six) hours as needed for muscle spasms. Do not take while driving or operating heavy machinery. (Patient not taking: Reported on 11/04/2021) 30 tablet 0   traMADol (ULTRAM) 50 MG tablet Take 1-2 tablets (50-100 mg total) by mouth every 6 (six) hours as needed for moderate pain. 15 tablet 0   No current facility-administered medications on file prior to visit.   Allergies  Allergen Reactions   Nsaids Nausea Only   Ceclor [Cefaclor] Swelling and Rash   Family History  Problem Relation Age  of Onset   Hypertension Mother    Heart disease Mother    Diabetes Mother    Cancer Mother        throat cancer   Hyperthyroidism Father    Hypertension Sister    Heart disease Paternal Uncle    Colon cancer Paternal Grandmother    Colon cancer Cousin        STOMACH   PE: BP 128/82 (BP Location: Right Arm, Patient Position: Sitting, Cuff Size: Normal)   Ht 5' 0.25" (1.53 m)   Wt 149 lb 6.4 oz (67.8 kg)   BMI 28.94 kg/m  Wt Readings from Last 3 Encounters:  04/22/22 149 lb 6.4 oz (67.8 kg)  11/23/21 134 lb 14.7 oz (61.2 kg)  11/11/21 135 lb (61.2 kg)   Constitutional: normal weight, in NAD Eyes:  EOMI, no exophthalmos ENT: no neck masses, no cervical lymphadenopathy Cardiovascular: RRR, No MRG Respiratory: CTA B Musculoskeletal: no deformities Skin:no rashes Neurological: no tremor with outstretched hands Diabetic Foot Exam - Simple   Simple Foot Form Diabetic Foot exam was performed with the following findings: Yes 04/22/2022  4:25 PM  Visual Inspection No deformities, no ulcerations, no other skin breakdown bilaterally: Yes Sensation Testing Intact to touch and monofilament testing bilaterally: Yes Pulse Check Posterior Tibialis and Dorsalis pulse intact bilaterally: Yes Comments Thick nails    ASSESSMENT: 1. DM2, non-insulin-dependent, uncontrolled, without long-term complications  2.  Thyroid nodule  3.  Thyrotoxicosis  PLAN:  1. Patient with long-standing, uncontrolled diabetes, on oral antidiabetic regimen, with good control.  Latest HbA1c was excellent, at 6.1% at last visit with Dr. Loanne Drilling.  At today's visit, HbA1c is still 6.1% (stable). -Sugars appear to be controlled at home but she is only checking  in the morning and I advised her to check later in the day, rotating check times. -Otherwise, we do not need to change her regimen.  I refilled her prescriptions. - I suggested to:  Patient Instructions  Please continue: - Metformin ER-Sitagliptin  1000-50 mg 2x a daily - Jardiance 10 mg before breakfast  Also, continue: - Methimazole 10 mg daily  Please stop at the lab.  Please return for another visit in 6 months.  - discussed about CBG targets for treatment: 80-130 mg/dL before meals and <180 mg/dL after meals; target HbA1c <7%. - given foot care handout  - given instructions for hypoglycemia management "15-15 rule"  - advised for yearly eye exams-she is up-to-date - Return to clinic in 6 months  2.  Thyroid nodule -Diagnosed in 1995 -S/p benign biopsy reportedly 1995 -No neck compression symptoms and no masses felt on palpation of her neck today -On CT angiography from 2021, the thyroid appeared unremarkable -I do not absolutely feel that we need to check an ultrasound right now, but I did advise her to let me know if she starts feeling neck compression symptoms.  3.  Thyrotoxicosis -Diagnosed in 2021 -Well-controlled on methimazole 10 mg daily -She tolerates methimazole well -Latest TFTs were normal in 09/2021 -We will recheck her TFTs today and change the methimazole dose accordingly  Needs refills of methimazole.   Component     Latest Ref Rng 04/22/2022  TSH     0.35 - 5.50 uIU/mL 1.56   T4,Free(Direct)     0.60 - 1.60 ng/dL 1.05   Triiodothyronine,Free,Serum     2.3 - 4.2 pg/mL 2.8    Thyroid tests are normal.  We will continue the same dose of methimazole.  Philemon Kingdom, MD PhD [facility]

## 2022-04-22 NOTE — Patient Instructions (Addendum)
Please continue: - Metformin ER-Sitagliptin 1000-50 mg 2x a daily - Jardiance 10 mg before breakfast  Also, continue: - Methimazole 10 mg daily  Please stop at the lab.  Please return for another visit in 6 months.  PATIENT INSTRUCTIONS FOR TYPE 2 DIABETES:  DIET AND EXERCISE Diet and exercise is an important part of diabetic treatment.  We recommended aerobic exercise in the form of brisk walking (working between 40-60% of maximal aerobic capacity, similar to brisk walking) for 150 minutes per week (such as 30 minutes five days per week) along with 3 times per week performing 'resistance' training (using various gauge rubber tubes with handles) 5-10 exercises involving the major muscle groups (upper body, lower body and core) performing 10-15 repetitions (or near fatigue) each exercise. Start at half the above goal but build slowly to reach the above goals. If limited by weight, joint pain, or disability, we recommend daily walking in a swimming pool with water up to waist to reduce pressure from joints while allow for adequate exercise.    BLOOD GLUCOSES Monitoring your blood glucoses is important for continued management of your diabetes. Please check your blood glucoses 2-4 times a day: fasting, before meals and at bedtime (you can rotate these measurements - e.g. one day check before the 3 meals, the next day check before 2 of the meals and before bedtime, etc.).   HYPOGLYCEMIA (low blood sugar) Hypoglycemia is usually a reaction to not eating, exercising, or taking too much insulin/ other diabetes drugs.  Symptoms include tremors, sweating, hunger, confusion, headache, etc. Treat IMMEDIATELY with 15 grams of Carbs: 4 glucose tablets  cup regular juice/soda 2 tablespoons raisins 4 teaspoons sugar 1 tablespoon honey Recheck blood glucose in 15 mins and repeat above if still symptomatic/blood glucose <100.  RECOMMENDATIONS TO REDUCE YOUR RISK OF DIABETIC COMPLICATIONS: * Take your  prescribed MEDICATION(S) * Follow a DIABETIC diet: Complex carbs, fiber rich foods, (monounsaturated and polyunsaturated) fats * AVOID saturated/trans fats, high fat foods, >2,300 mg salt per day. * EXERCISE at least 5 times a week for 30 minutes or preferably daily.  * DO NOT SMOKE OR DRINK more than 1 drink a day. * Check your FEET every day. Do not wear tightfitting shoes. Contact us if you develop an ulcer * See your EYE doctor once a year or more if needed * Get a FLU shot once a year * Get a PNEUMONIA vaccine once before and once after age 28 years  GOALS:  * Your Hemoglobin A1c of <7%  * fasting sugars need to be 80-130 * after meals sugars need to be <180 (2h after you start eating) * Your Systolic BP should be 627 or lower  * Your Diastolic BP should be 80 or lower  * Your HDL (Good Cholesterol) should be 40 or higher  * Your LDL (Bad Cholesterol) should be ideally <70. * Your Triglycerides should be 150 or lower  * Your Urine microalbumin (kidney function) should be <30 * Your Body Mass Index should be 25 or lower   Please consider the following ways to cut down carbs and fat and increase fiber and micronutrients in your diet: - substitute whole grain for white bread or pasta - substitute brown rice for white rice - substitute 90-calorie flat bread pieces for slices of bread when possible - substitute sweet potatoes or yams for white potatoes - substitute humus for margarine - substitute tofu for cheese when possible - substitute almond or rice milk for regular  milk (would not drink soy milk daily due to concern for soy estrogen influence on breast cancer risk) - substitute dark chocolate for other sweets when possible - substitute water - can add lemon or orange slices for taste - for diet sodas (artificial sweeteners will trick your body that you can eat sweets without getting calories and will lead you to overeating and weight gain in the long run) - do not skip breakfast  or other meals (this will slow down the metabolism and will result in more weight gain over time)  - can try smoothies made from fruit and almond/rice milk in am instead of regular breakfast - can also try old-fashioned (not instant) oatmeal made with almond/rice milk in am - order the dressing on the side when eating salad at a restaurant (pour less than half of the dressing on the salad) - eat as little meat as possible - can try juicing, but should not forget that juicing will get rid of the fiber, so would alternate with eating raw veg./fruits or drinking smoothies - use as little oil as possible, even when using olive oil - can dress a salad with a mix of balsamic vinegar and lemon juice, for e.g. - use agave nectar, stevia sugar, or regular sugar rather than artificial sweateners - steam or broil/roast veggies  - snack on veggies/fruit/nuts (unsalted, preferably) when possible, rather than processed foods - reduce or eliminate aspartame in diet (it is in diet sodas, chewing gum, etc) Read the labels!  Try to read Dr. Janene Harvey book: "Program for Reversing Diabetes" for other ideas for healthy eating.

## 2022-04-23 LAB — T3, FREE: T3, Free: 2.8 pg/mL (ref 2.3–4.2)

## 2022-04-23 LAB — TSH: TSH: 1.56 u[IU]/mL (ref 0.35–5.50)

## 2022-04-23 LAB — T4, FREE: Free T4: 1.05 ng/dL (ref 0.60–1.60)

## 2022-04-23 MED ORDER — METHIMAZOLE 10 MG PO TABS: 10.0000 mg | ORAL_TABLET | Freq: Every day | ORAL | 3 refills | Status: DC

## 2022-06-06 ENCOUNTER — Ambulatory Visit (HOSPITAL_COMMUNITY)
Admission: EM | Admit: 2022-06-06 | Discharge: 2022-06-06 | Disposition: A | Discharge status: Home / Self Care | Payer: Managed Care, Other (non HMO) | Attending: Family Medicine | Admitting: Family Medicine

## 2022-06-06 ENCOUNTER — Encounter (HOSPITAL_COMMUNITY): Payer: Self-pay

## 2022-06-06 DIAGNOSIS — W540XXA Bitten by dog, initial encounter: Secondary | ICD-10-CM | POA: Diagnosis not present

## 2022-06-06 DIAGNOSIS — S61412A Laceration without foreign body of left hand, initial encounter: Secondary | ICD-10-CM

## 2022-06-06 MED ORDER — TETANUS-DIPHTH-ACELL PERTUSSIS 5-2.5-18.5 LF-MCG/0.5 IM SUSY
PREFILLED_SYRINGE | INTRAMUSCULAR | Status: AC
  Filled 2022-06-06: qty 0.5

## 2022-06-06 MED ORDER — AMOXICILLIN-POT CLAVULANATE 875-125 MG PO TABS: 1.0000 | ORAL_TABLET | Freq: Two times a day (BID) | ORAL | 0 refills | Status: AC

## 2022-06-06 MED ORDER — TETANUS-DIPHTH-ACELL PERTUSSIS 5-2.5-18.5 LF-MCG/0.5 IM SUSY
0.5000 mL | PREFILLED_SYRINGE | Freq: Once | INTRAMUSCULAR | Status: AC
Start: 2022-06-06 — End: 2022-06-06
  Administered 2022-06-06: 0.5 mL via INTRAMUSCULAR

## 2022-06-06 NOTE — Discharge Instructions (Signed)
Clean the wound twice daily and put new Neosporin on them.  You have been given a Tdap booster to prevent tetanus  The dog who bit you should be observed for 10 days.  If the dog begins to become ill, then we would start rabies treatment and vaccine.  Take amoxicillin-clavulanate 875 mg--1 tab twice daily with food for 5 days

## 2022-06-06 NOTE — ED Triage Notes (Signed)
Pt was bitten by a dog today on the left hand . Pt is here for a rabbies vaccine

## 2022-06-06 NOTE — ED Provider Notes (Addendum)
[facility]    CSN: [id number] Arrival date & time: 06/06/22  1035      History   Chief Complaint Chief Complaint  Patient presents with   Animal Bite    HPI Janet Kelley is a 61 y.o. female.    Animal Bite  Here for dog bite to her left hand.  The dog belongs to a friend, and has been known to be aggressive.  The dog is not ill at present.  The last rabies vaccine for the dog was early 2022, and she was due to get an updated vaccine in early 2023.  Animal control report has been made, and the dog will be observed for the next 10 days.  Patient's last tetanus was over 5 years ago.  Past Medical History:  Diagnosis Date   ADHD (attention deficit hyperactivity disorder)    Anxiety    Cervical dysplasia    Diabetes mellitus    Type 2   Elevated cholesterol    Endometriosis    Fibroid    GERD (gastroesophageal reflux disease)    Hyperthyroidism    Vitamin D deficiency 05/2013    Patient Active Problem List   Diagnosis Date Noted   Cholelithiasis with chronic cholecystitis 11/22/2021   Pain due to onychomycosis of toenails of both feet 07/13/2019   Diabetes (HCC) 07/13/2019   Hyperthyroidism 07/09/2019   History of vitamin D deficiency 10/24/2015   History of thyroid cyst 05/09/2015   Elevated cholesterol    Anxiety    Cervical dysplasia    Endometriosis    SVT (supraventricular tachycardia) 09/01/2011    Past Surgical History:  Procedure Laterality Date   CERVICAL DISC SURGERY     CHOLECYSTECTOMY N/A 11/23/2021   Procedure: LAPAROSCOPIC CHOLECYSTECTOMY WITH INTRAOPERATIVE CHOLANGIOGRAM;  Surgeon: Darnell Level, MD;  Location: [facility];  Service: General;  Laterality: N/A;   COLPOSCOPY     GYNECOLOGIC CRYOSURGERY     KNEE SURGERY Left    Arthroscopic   PELVIC LAPAROSCOPY     DL laser endometriosis   VAGINAL HYSTERECTOMY  06/08/2007   LAV[facility]    OB History     Gravida  0   Para  0   Term  0   Preterm  0   AB  0   Living  0       SAB  0   IAB  0   Ectopic  0   Multiple  0   Live Births  0            Home Medications    Prior to Admission medications   Medication Sig Start Date End Date Taking? Authorizing Provider  amoxicillin-clavulanate (AUGMENTIN) 875-125 MG tablet Take 1 tablet by mouth 2 (two) times daily for 5 days. 06/06/22 06/11/22 Yes Zenia Resides, MD  acetaminophen (TYLENOL) 500 MG tablet Take 1,000 mg by mouth every 6 (six) hours as needed (pain).    [provider]  aspirin EC 81 MG tablet Take 81 mg by mouth daily.    [provider]  busPIRone (BUSPAR) 10 MG tablet Take 20 mg by mouth 2 (two) times daily. 10/27/21   [provider]  Cholecalciferol (VITAMIN D3 PO) Take 1 tablet by mouth daily.    [provider]  citalopram (CELEXA) 40 MG tablet Take 40 mg by mouth every evening. 08/16/11   [provider]  clonazePAM (KLONOPIN) 0.5 MG tablet Take 0.5 mg by mouth in the morning and at bedtime. 07/15/11  [provider]  empagliflozin (JARDIANCE) 10 MG TABS tablet Take 1 tablet (10 mg total) by mouth daily. 04/22/22   Carlus Pavlov, MD  ezetimibe (ZETIA) 10 MG tablet Take 10 mg by mouth at bedtime.     [provider]  fexofenadine (ALLEGRA) 180 MG tablet Take 180 mg by mouth in the morning.    [provider]  JANUMET 50-1000 MG tablet Take 1 tablet by mouth 2 (two) times daily. 04/22/22   Carlus Pavlov, MD  lisdexamfetamine (VYVANSE) 70 MG capsule Take 70 mg by mouth in the morning.    [provider]  loperamide (IMODIUM) 2 MG capsule Take 2-4 mg by mouth 4 (four) times daily as needed for diarrhea or loose stools.    [provider]  losartan (COZAAR) 25 MG tablet Take 25 mg by mouth in the morning.    [provider]  methimazole (TAPAZOLE) 10 MG tablet Take 1 tablet (10 mg total) by mouth daily. 04/23/22   Carlus Pavlov, MD  Multiple Vitamin (MULTIVITAMIN WITH MINERALS)  TABS tablet Take 1 tablet by mouth daily.    [provider]  omeprazole (PRILOSEC) 40 MG capsule Take 40 mg by mouth in the morning and at bedtime.    [provider]  ondansetron (ZOFRAN-ODT) 4 MG disintegrating tablet Take 4 mg by mouth every 8 (eight) hours as needed for nausea or vomiting.    [provider]  potassium chloride (KLOR-CON) 10 MEQ tablet Take 10 mEq by mouth every evening.    [provider]  rosuvastatin (CRESTOR) 40 MG tablet Take 40 mg by mouth at bedtime.     [provider]  tiZANidine (ZANAFLEX) 4 MG tablet Take 1 tablet (4 mg total) by mouth every 6 (six) hours as needed for muscle spasms. Do not take while driving or operating heavy machinery. Patient not taking: Reported on 11/04/2021 08/14/21   Valentino Nose, NP  traMADol (ULTRAM) 50 MG tablet Take 1-2 tablets (50-100 mg total) by mouth every 6 (six) hours as needed for moderate pain. 11/23/21   Darnell Level, MD    Family History Family History  Problem Relation Age of Onset   Hypertension Mother    Heart disease Mother    Diabetes Mother    Cancer Mother        throat cancer   Hyperthyroidism Father    Hypertension Sister    Heart disease Paternal Uncle    Colon cancer Paternal Grandmother    Colon cancer Cousin        STOMACH    Social History Social History   Tobacco Use   Smoking status: Former    Types: Cigarettes    Quit date: 03/16/2006    Years since quitting: 16.2   Smokeless tobacco: Never  Vaping Use   Vaping Use: Never used  Substance Use Topics   Alcohol use: Yes    Alcohol/week: 2.0 standard drinks of alcohol    Types: 2 Standard drinks or equivalent per week    Comment: occasiaonal   Drug use: No     Allergies   Nsaids and Ceclor [cefaclor]   Review of Systems Review of Systems   Physical Exam Triage Vital Signs ED Triage Vitals [06/06/22 1055]  Enc Vitals Group     BP 115/71     Pulse Rate 86     Resp 16     Temp  98.2 F (36.8 C)     Temp Source Oral  SpO2 95 %     Weight      Height      Head Circumference      Peak Flow      Pain Score 7     Pain Loc      Pain Edu?      Excl. in GC?    No data found.  Updated Vital Signs BP 115/71 (BP Location: Right Arm)   Pulse 86   Temp 98.2 F (36.8 C) (Oral)   Resp 16   SpO2 95%   Visual Acuity Right Eye Distance:   Left Eye Distance:   Bilateral Distance:    Right Eye Near:   Left Eye Near:    Bilateral Near:     Physical Exam Vitals reviewed.  Constitutional:      General: She is not in acute distress.    Appearance: She is not ill-appearing, toxic-appearing or diaphoretic.  Skin:    Coloration: Skin is not pale.     Comments: There is a curvilinear laceration on the left palm on the hypothenar eminence.  It is sealed back down and is not bleeding.  It is half a centimeter in length.  There is another tiny puncture wound on the lateral hand.  Neurological:     Mental Status: She is alert and oriented to person, place, and time.  Psychiatric:        Behavior: Behavior normal.      UC Treatments / Results  Labs (all labs ordered are listed, but only abnormal results are displayed) Labs Reviewed - No data to display  EKG   Radiology No results found.  Procedures Procedures (including critical care time)  Medications Ordered in UC Medications  Tdap (BOOSTRIX) injection 0.5 mL (0.5 mLs Intramuscular Given 06/06/22 1133)    Initial Impression / Assessment and Plan / UC Course  I have reviewed the triage vital signs and the nursing notes.  Pertinent labs & imaging results that were available during my care of the patient were reviewed by me and considered in my medical decision making (see chart for details).        The wound is cleansed with Hibiclens.  Bandages applied.  We discussed options and with the dog being partially vaccinated and available for observation, we are not beginning postexposure  prophylaxis at this point.  Augmentin is prescribed to prevent infection. Final Clinical Impressions(s) / UC Diagnoses   Final diagnoses:  Dog bite, initial encounter  Laceration of left hand without foreign body, initial encounter     Discharge Instructions      Clean the wound twice daily and put new Neosporin on them.  You have been given a Tdap booster to prevent tetanus  The dog who bit you should be observed for 10 days.  If the dog begins to become ill, then we would start rabies treatment and vaccine.  Take amoxicillin-clavulanate 875 mg--1 tab twice daily with food for 5 days      ED Prescriptions     Medication Sig Dispense Auth. Provider   amoxicillin-clavulanate (AUGMENTIN) 875-125 MG tablet Take 1 tablet by mouth 2 (two) times daily for 5 days. 10 tablet Marlinda Mike Janace Aris, MD      PDMP not reviewed this encounter.   Zenia Resides, MD 06/06/22 1132    Zenia Resides, MD 06/09/22 [phone]

## 2022-07-14 ENCOUNTER — Encounter: Payer: Self-pay | Admitting: Obstetrics & Gynecology

## 2022-07-19 ENCOUNTER — Encounter: Payer: Self-pay | Admitting: Obstetrics & Gynecology

## 2022-10-08 ENCOUNTER — Telehealth: Payer: Self-pay

## 2022-10-08 NOTE — Telephone Encounter (Signed)
Pt called to advise her [place] Pepper is giving her a yeast infection. Requested a rx be called in.

## 2022-10-11 MED ORDER — FLUCONAZOLE 150 MG PO TABS: 150.0000 mg | ORAL_TABLET | Freq: Once | ORAL | 1 refills | Status: DC

## 2022-10-11 NOTE — Telephone Encounter (Signed)
Rx sent 

## 2022-10-21 ENCOUNTER — Ambulatory Visit: Payer: Managed Care, Other (non HMO) | Admitting: Internal Medicine

## 2022-10-21 ENCOUNTER — Encounter: Payer: Self-pay | Admitting: Internal Medicine

## 2022-10-21 VITALS — BP 112/62 | HR 81 | Ht 60.25 in | Wt 152.2 lb

## 2022-10-21 DIAGNOSIS — E0590 Thyrotoxicosis, unspecified without thyrotoxic crisis or storm: Secondary | ICD-10-CM

## 2022-10-21 DIAGNOSIS — Z7984 Long term (current) use of oral hypoglycemic drugs: Secondary | ICD-10-CM

## 2022-10-21 DIAGNOSIS — E119 Type 2 diabetes mellitus without complications: Secondary | ICD-10-CM | POA: Diagnosis not present

## 2022-10-21 DIAGNOSIS — E041 Nontoxic single thyroid nodule: Secondary | ICD-10-CM

## 2022-10-21 LAB — POCT GLYCOSYLATED HEMOGLOBIN (HGB A1C): Hemoglobin A1C: 6.1 % — AB (ref 4.0–5.6)

## 2022-10-21 NOTE — Patient Instructions (Addendum)
Please continue: - Metformin ER-Sitagliptin 1000-50 mg 2x a daily  Move: - Jardiance 10 mg before breakfast  Also, continue: - Methimazole 10 mg daily  Please stop at the lab.  Please return for another visit in 6 months.

## 2022-10-21 NOTE — Progress Notes (Signed)
Patient ID: Janet Kelley, female   DOB: 1960/08/08, 62 y.o.   MRN: [id number]  HPI: Janet Kelley is a 62 y.o.-year-old female, returning for follow-up for DM2, dx in 2009, non-insulin-dependent, controlled, without long-term complications and also, thyroid nodule and thyrotoxicosis. Pt. previously saw Dr. Everardo All, but last visit with me 6 months ago.  Interim hx: No increased urination, blurry vision, nausea, chest pain. She has some vaginal itching - improved now.  She had Diflucan since last visit.  DM2: Reviewed HbA1c: Lab Results  Component Value Date   HGBA1C 6.1 (A) 10/21/2022   HGBA1C 6.1 (H) 11/11/2021   HGBA1C 6.2 09/21/2021   HGBA1C 6.4 (A) 06/23/2021   HGBA1C 6.5 (A) 12/19/2020   HGBA1C 6.8 (A) 09/16/2020   HGBA1C 6.3 (A) 06/17/2020   HGBA1C 6.6 (A) 04/15/2020   HGBA1C 6.3 (A) 02/19/2020   HGBA1C 6.3 (A) 12/14/2019   HGBA1C 6.7 (A) 10/12/2019   HGBA1C 6.1 07/12/2019   Pt is on a regimen of: - Metformin ER-Sitagliptin 1000-50 mg 2x a day - Jardiance 10 mg before breakfast >> at night She previously tried Byetta, Victoza, Trulicity and they cause nausea. Jardiance caused vaginitis in the past, not tolerated well.  Pt checks her sugars 2-3x a week and they are: - am: 110-120 >> 90-125 - 2h after b'fast: n/c - before lunch: n/c - 2h after lunch: n/c - before dinner: n/c - 2h after dinner: n/c - bedtime: n/c - nighttime: n/c Lowest sugar was 90s >> 90; she has hypoglycemia awareness at 70.  Highest sugar was 200 >> 130.  Glucometer:Freestyle  - no CKD, last BUN/creatinine:  Lab Results  Component Value Date   BUN 12 11/11/2021   Lab Results  Component Value Date   CREATININE 0.69 11/11/2021   No results found for: "MICRALBCREAT" 02/23/204: ACR 69 07/28/2021: ACR 90.9 She is on Cozaar 25 mg.  -+ HL; last set of lipids: 07/30/2022: 138/77/53/70 07/28/2021: 139/68/49/77 Lipid Panel  No results found for: "CHOL", "TRIG", "HDL", "CHOLHDL",  "VLDL", "LDLCALC", "LDLDIRECT", "LABVLDL" On Crestor 40 mg daily and Zetia 10 mg daily.  - last eye exam was in ~02/2022. No DR reportedly.   - no numbness and tingling in her feet. Last foot exam 09/2020.  Thyroid nodule:  Patient was diagnosed with a thyroid nodule in 1995, and a biopsy returned benign in the same year, reportedly.  Pt denies: - feeling nodules in neck - hoarseness - dysphagia - choking  Thyrotoxicosis:  She was diagnosed with thyrotoxicosis in 2021.  She did not have imaging for this.  She is currently on methimazole 10 mg daily.  She missed 1-2 doses recently. She was previously on metoprolol, now off.  Reviewed her TFTs: Lab Results  Component Value Date   TSH 1.56 04/22/2022   TSH 2.91 09/21/2021   TSH 3.12 06/23/2021   TSH 2.54 03/05/2021   TSH 1.99 12/19/2020   TSH 4.50 06/17/2020   TSH 0.57 04/15/2020   TSH 18.81 (H) 02/19/2020   TSH 8.64 (H) 12/14/2019   TSH <0.01 (L) 10/12/2019   TSH <0.01 (L) 08/10/2019   TSH <0.01 (L) 07/09/2019   TSH <0.010 (L) 06/12/2019   TSH 1.059 05/09/2015   FREET4 1.05 04/22/2022   T3FREE 2.8 04/22/2022   No FH of thyroid cancer. No h/o radiation tx to head or neck. No recent contrast studies. No herbal supplements. No Biotin use. No recent steroids use.   She has a history of vitamin D deficiency - on  2000 units, fibroids, endometriosis.  ROS: + see HPI  Past Medical History:  Diagnosis Date   ADHD (attention deficit hyperactivity disorder)    Anxiety    Cervical dysplasia    Diabetes mellitus    Type 2   Elevated cholesterol    Endometriosis    Fibroid    GERD (gastroesophageal reflux disease)    Hyperthyroidism    Vitamin D deficiency 05/2013   Past Surgical History:  Procedure Laterality Date   CERVICAL DISC SURGERY     CHOLECYSTECTOMY N/A 11/23/2021   Procedure: LAPAROSCOPIC CHOLECYSTECTOMY WITH INTRAOPERATIVE CHOLANGIOGRAM;  Surgeon: Darnell Level, MD;  Location: [facility];  Service: General;   Laterality: N/A;   COLPOSCOPY     GYNECOLOGIC CRYOSURGERY     KNEE SURGERY Left    Arthroscopic   PELVIC LAPAROSCOPY     DL laser endometriosis   VAGINAL HYSTERECTOMY  06/08/2007   LA[facility]   Social History   Socioeconomic History   Marital status: Single    Spouse name: Not on file   Number of children: Not on file   Years of education: Not on file   Highest education level: Not on file  Occupational History   Not on file  Tobacco Use   Smoking status: Former    Types: Cigarettes    Quit date: 03/16/2006    Years since quitting: 16.6   Smokeless tobacco: Never  Vaping Use   Vaping Use: Never used  Substance and Sexual Activity   Alcohol use: Yes    Alcohol/week: 2.0 standard drinks of alcohol    Types: 2 Standard drinks or equivalent per week    Comment: occasiaonal   Drug use: No   Sexual activity: Yes    Partners: Male    Birth control/protection: Surgical    Comment: 1st intercourse- 18, partners- greater than 5  Other Topics Concern   Not on file  Social History Narrative   Not on file   Social Determinants of Health   Financial Resource Strain: Not on file  Food Insecurity: Not on file  Transportation Needs: Not on file  Physical Activity: Not on file  Stress: Not on file  Social Connections: Not on file  Intimate Partner Violence: Not on file   Current Outpatient Medications on File Prior to Visit  Medication Sig Dispense Refill   acetaminophen (TYLENOL) 500 MG tablet Take 1,000 mg by mouth every 6 (six) hours as needed (pain).     aspirin EC 81 MG tablet Take 81 mg by mouth daily.     busPIRone (BUSPAR) 10 MG tablet Take 20 mg by mouth 2 (two) times daily.     Cholecalciferol (VITAMIN D3 PO) Take 1 tablet by mouth daily.     citalopram (CELEXA) 40 MG tablet Take 40 mg by mouth every evening.     clonazePAM (KLONOPIN) 0.5 MG tablet Take 0.5 mg by mouth in the morning and at bedtime.     empagliflozin (JARDIANCE) 10 MG TABS tablet Take 1 tablet (10 mg  total) by mouth daily. 90 tablet 3   ezetimibe (ZETIA) 10 MG tablet Take 10 mg by mouth at bedtime.      fexofenadine (ALLEGRA) 180 MG tablet Take 180 mg by mouth in the morning.     JANUMET 50-1000 MG tablet Take 1 tablet by mouth 2 (two) times daily. 180 tablet 3   lisdexamfetamine (VYVANSE) 70 MG capsule Take 70 mg by mouth in the morning.     loperamide (IMODIUM) 2 MG  capsule Take 2-4 mg by mouth 4 (four) times daily as needed for diarrhea or loose stools.     losartan (COZAAR) 25 MG tablet Take 25 mg by mouth in the morning.     methimazole (TAPAZOLE) 10 MG tablet Take 1 tablet (10 mg total) by mouth daily. 90 tablet 3   Multiple Vitamin (MULTIVITAMIN WITH MINERALS) TABS tablet Take 1 tablet by mouth daily.     omeprazole (PRILOSEC) 40 MG capsule Take 40 mg by mouth in the morning and at bedtime.     ondansetron (ZOFRAN-ODT) 4 MG disintegrating tablet Take 4 mg by mouth every 8 (eight) hours as needed for nausea or vomiting.     potassium chloride (KLOR-CON) 10 MEQ tablet Take 10 mEq by mouth every evening.     rosuvastatin (CRESTOR) 40 MG tablet Take 40 mg by mouth at bedtime.      tiZANidine (ZANAFLEX) 4 MG tablet Take 1 tablet (4 mg total) by mouth every 6 (six) hours as needed for muscle spasms. Do not take while driving or operating heavy machinery. (Patient not taking: Reported on 11/04/2021) 30 tablet 0   traMADol (ULTRAM) 50 MG tablet Take 1-2 tablets (50-100 mg total) by mouth every 6 (six) hours as needed for moderate pain. 15 tablet 0   No current facility-administered medications on file prior to visit.   Allergies  Allergen Reactions   Nsaids Nausea Only   Ceclor [Cefaclor] Swelling and Rash   Family History  Problem Relation Age of Onset   Hypertension Mother    Heart disease Mother    Diabetes Mother    Cancer Mother        throat cancer   Hyperthyroidism Father    Hypertension Sister    Heart disease Paternal Uncle    Colon cancer Paternal Grandmother    Colon  cancer Cousin        STOMACH   PE: BP 112/62 (BP Location: Right Arm, Patient Position: Sitting, Cuff Size: Normal)   Pulse 81   Ht 5' 0.25" (1.53 m)   Wt 152 lb 3.2 oz (69 kg)   SpO2 95%   BMI 29.48 kg/m  Wt Readings from Last 3 Encounters:  10/21/22 152 lb 3.2 oz (69 kg)  04/22/22 149 lb 6.4 oz (67.8 kg)  11/23/21 134 lb 14.7 oz (61.2 kg)    Constitutional: normal weight, in NAD Eyes:  EOMI, no exophthalmos ENT: no neck masses, no cervical lymphadenopathy Cardiovascular: RRR, No MRG Respiratory: CTA B Musculoskeletal: no deformities Skin:no rashes Neurological: no tremor with outstretched hands Diabetic Foot Exam - Simple   Simple Foot Form Diabetic Foot exam was performed with the following findings: Yes 10/21/2022  4:25 PM  Visual Inspection No deformities, no ulcerations, no other skin breakdown bilaterally: Yes Sensation Testing Intact to touch and monofilament testing bilaterally: Yes Pulse Check Posterior Tibialis and Dorsalis pulse intact bilaterally: Yes Comments    ASSESSMENT: 1. DM2, non-insulin-dependent, uncontrolled, without long-term complications  2.  Thyroid nodule  3.  Thyrotoxicosis  PLAN:  1. Patient with longstanding controlled, type 2 diabetes, on oral antidiabetic regimen with metformin, DPP 4 inhibitor, and SGLT2 inhibitor.  At last visit, HbA1c was 6.1%, excellent.  We did not change her regimen.  I did advise her to check some sugars later in the day, rotating check times but she did not do so yet.  We again discussed about the importance of doing this. -At today's visit, she tells me that she is taking the [place]  at night and I advised her about moving it in the morning but otherwise we continued the same regimen. - I suggested to:  Patient Instructions  Please continue: - Metformin ER-Sitagliptin 1000-50 mg 2x a daily  Move: - Jardiance 10 mg before breakfast  Also, continue: - Methimazole 10 mg daily  Please stop at the  lab.  Please return for another visit in 6 months.  - we checked her HbA1c: 6.1% (stable, at goal) - advised to check sugars at different times of the day - 1x a day, rotating check times - advised for yearly eye exams >> she is UTD - return to clinic in 6 months  2.  Thyroid nodule -Diagnosed in 1995 -Status post benign biopsy reportedly in 1995 -No neck compression symptoms and no masses felt on palpation of her neck today -On CT angiography from 2021, the thyroid appeared unremarkable -Will continue to follow her clinically unless she develops neck compression symptoms, we will check an ultrasound  3.  Thyrotoxicosis -Diagnosed in 2021 -Well-controlled on methimazole 10 mg daily -Tolerates methimazole well -TFTs were normal at last visit, in 04/2022 -Will repeat these today and change the methimazole dose accordingly.  Component     Latest Ref Rng 10/21/2022  TSH     0.35 - 5.50 uIU/mL 5.78 (H)   T4,Free(Direct)     0.60 - 1.60 ng/dL 2.95   Hemoglobin A2Z     4.0 - 5.6 % 6.1 !   Triiodothyronine,Free,Serum     2.3 - 4.2 pg/mL 2.6   TSH is elevated.  Will need to decrease the methimazole dose to 5 mg daily and repeat the tests in 1.5 months.  Carlus Pavlov, MD PhD [facility] Endocrinology

## 2022-10-21 NOTE — Addendum Note (Signed)
Addended by: Kenyon Ana on: 10/21/2022 04:17 PM   Modules accepted: Orders

## 2022-10-22 LAB — TSH: TSH: 5.78 u[IU]/mL — ABNORMAL HIGH (ref 0.35–5.50)

## 2022-10-22 LAB — T4, FREE: Free T4: 0.92 ng/dL (ref 0.60–1.60)

## 2022-10-22 LAB — T3, FREE: T3, Free: 2.6 pg/mL (ref 2.3–4.2)

## 2022-10-22 MED ORDER — METHIMAZOLE 5 MG PO TABS: 5.0000 mg | ORAL_TABLET | Freq: Every day | ORAL | 3 refills | Status: DC

## 2022-11-16 ENCOUNTER — Ambulatory Visit (INDEPENDENT_AMBULATORY_CARE_PROVIDER_SITE_OTHER): Payer: Managed Care, Other (non HMO) | Admitting: Obstetrics & Gynecology

## 2022-11-16 ENCOUNTER — Other Ambulatory Visit: Payer: Self-pay

## 2022-11-16 ENCOUNTER — Encounter: Payer: Self-pay | Admitting: Obstetrics & Gynecology

## 2022-11-16 VITALS — BP 114/70 | HR 95 | Ht 60.25 in | Wt 154.0 lb

## 2022-11-16 DIAGNOSIS — Z01419 Encounter for gynecological examination (general) (routine) without abnormal findings: Secondary | ICD-10-CM | POA: Diagnosis not present

## 2022-11-16 DIAGNOSIS — N393 Stress incontinence (female) (male): Secondary | ICD-10-CM | POA: Diagnosis not present

## 2022-11-16 DIAGNOSIS — R151 Fecal smearing: Secondary | ICD-10-CM

## 2022-11-16 DIAGNOSIS — Z780 Asymptomatic menopausal state: Secondary | ICD-10-CM | POA: Diagnosis not present

## 2022-11-16 DIAGNOSIS — Z90710 Acquired absence of both cervix and uterus: Secondary | ICD-10-CM

## 2022-11-16 NOTE — Progress Notes (Signed)
Janet Kelley 01-06-1961 [id number]   History:    62 y.o.  G0 Single.    RP:  Established patient presenting for annual gyn exam    HPI: S/P Vaginal Hysterectomy.  Menopause, well on no HRT.  No pelvic pain.  Abstinent.  Pap Neg 11/2019.  Urine normal, except for mild SUI, especially when the bladder is full.  BMs normal, sometimes mild leakage of soft stools.  Breasts normal. Mammo Rt Neg, Lt Dx/US Benign in 11/2022. BMI 29.83.  Walks a lot at [place]. BD Normal 11/2016. DM type 2 on Janumet. Hyperthyroidism on Methimazole.  Health labs with Fam MD. Alen Bleacher 2023.    Past medical history,surgical history, family history and social history were all reviewed and documented in the EPIC chart.  Gynecologic History No LMP recorded. Patient has had a hysterectomy.  Obstetric History OB History  Gravida Para Term Preterm AB Living  0 0 0 0 0 0  SAB IAB Ectopic Multiple Live Births  0 0 0 0 0     ROS: A ROS was performed and pertinent positives and negatives are included in the history. GENERAL: No fevers or chills. HEENT: No change in vision, no earache, sore throat or sinus congestion. NECK: No pain or stiffness. CARDIOVASCULAR: No chest pain or pressure. No palpitations. PULMONARY: No shortness of breath, cough or wheeze. GASTROINTESTINAL: No abdominal pain, nausea, vomiting or diarrhea, melena or bright red blood per rectum. GENITOURINARY: No urinary frequency, urgency, hesitancy or dysuria. MUSCULOSKELETAL: No joint or muscle pain, no back pain, no recent trauma. DERMATOLOGIC: No rash, no itching, no lesions. ENDOCRINE: No polyuria, polydipsia, no heat or cold intolerance. No recent change in weight. HEMATOLOGICAL: No anemia or easy bruising or bleeding. NEUROLOGIC: No headache, seizures, numbness, tingling or weakness. PSYCHIATRIC: No depression, no loss of interest in normal activity or change in sleep pattern.     Exam:   Ht 5' 0.25" (1.53 m)   Wt 154 lb (69.9 kg)   BMI 29.83  kg/m   Body mass index is 29.83 kg/m.  General appearance : Well developed well nourished female. No acute distress HEENT: Eyes: no retinal hemorrhage or exudates,  Neck supple, trachea midline, no carotid bruits, no thyroidmegaly Lungs: Clear to auscultation, no rhonchi or wheezes, or rib retractions  Heart: Regular rate and rhythm, no murmurs or gallops Breast:Examined in sitting and supine position were symmetrical in appearance, no palpable masses or tenderness,  no skin retraction, no nipple inversion, no nipple discharge, no skin discoloration, no axillary or supraclavicular lymphadenopathy Abdomen: no palpable masses or tenderness, no rebound or guarding Extremities: no edema or skin discoloration or tenderness  Pelvic: Vulva: Normal             Vagina: No gross lesions or discharge  Cervix/Uterus absent  Adnexa  Without masses or tenderness  Perianal area normal.  Declines Rectal exam.   Assessment/Plan:  62 y.o. female for annual exam   1. Well female exam with routine gynecological exam S/P Vaginal Hysterectomy.  Menopause, well on no HRT.  No pelvic pain.  Abstinent.  Pap Neg 11/2019.  Urine normal, except for mild SUI, especially when the bladder is full.  BMs normal, sometimes mild leakage of soft stools.  Breasts normal. Mammo Rt Neg, Lt Dx/US Benign in 11/2022. BMI 29.83.  Walks a lot at [place]. BD Normal 11/2016. DM type 2 on Janumet. Hyperthyroidism on Methimazole.  Health labs with Fam MD. Alen Bleacher 2023.  2. S/P total hysterectomy  3.  Postmenopause S/P Vaginal Hysterectomy.  Menopause, well on no HRT.  No pelvic pain.  Abstinent.    4. SUI (stress urinary incontinence, female) Counseling done.  Refer to PT for pelvic floor strengthening.  5. Fecal smearing  Counseling done.  Refer to PT for pelvic floor strengthening.  Genia Del MD, 4:12 PM

## 2022-11-30 ENCOUNTER — Ambulatory Visit: Payer: Managed Care, Other (non HMO) | Admitting: Obstetrics & Gynecology

## 2022-11-30 ENCOUNTER — Encounter: Payer: Self-pay | Admitting: Obstetrics & Gynecology

## 2022-11-30 VITALS — BP 124/80 | HR 101

## 2022-11-30 DIAGNOSIS — Z90710 Acquired absence of both cervix and uterus: Secondary | ICD-10-CM | POA: Diagnosis not present

## 2022-11-30 DIAGNOSIS — N939 Abnormal uterine and vaginal bleeding, unspecified: Secondary | ICD-10-CM | POA: Diagnosis not present

## 2022-11-30 DIAGNOSIS — N958 Other specified menopausal and perimenopausal disorders: Secondary | ICD-10-CM | POA: Diagnosis not present

## 2022-11-30 MED ORDER — ESTRADIOL 0.1 MG/GM VA CREA
1.0000 | TOPICAL_CREAM | VAGINAL | 4 refills | Status: DC
Start: 2022-12-02 — End: 2024-01-11

## 2022-11-30 NOTE — Progress Notes (Signed)
    Janet Kelley August 14, 1960 [id number]        62 y.o.  G0  RP: PMB s/p Total Hysterectomy  HPI: Very mild vaginal spotting on Friday 11/26/22.  Seen at Urgent Care Saturday am 11/27/22.  Negative Gyn exam except for very mild blood on the vaginal mucosa.  S/P Vaginal Hysterectomy.  Menopause, well on no HRT.  No pelvic pain.  Abstinent. Nothing inserted in the vagina per patient.  No UTI Sx, no blood in urine.  BMs normal.  No blood in stool.   OB History  Gravida Para Term Preterm AB Living  0 0 0 0 0 0  SAB IAB Ectopic Multiple Live Births  0 0 0 0 0    Past medical history,surgical history, problem list, medications, allergies, family history and social history were all reviewed and documented in the EPIC chart.   Directed ROS with pertinent positives and negatives documented in the history of present illness/assessment and plan.  Exam:  Vitals:   11/30/22 1339  BP: 124/80  Pulse: (!) 101  SpO2: 98%   General appearance:  Normal  Gynecologic exam: Vulva with mild inflammation appearing as mildly whiter skin (patient is african [place]).  No vulvar lesion of bleeding seen.  Speculum:  Very mild blood at the surface of the vaginal mucosa.  No vaginal lesion or origin of bleeding seen.  No active bleeding.  Bimanual exam:  No pelvic mass, NT.  No palpable lesion vaginally.    Assessment/Plan:  62 y.o. G0P0000   1. Vaginal bleeding Very mild vaginal spotting on Friday 11/26/22.  Seen at Urgent Care Saturday am 11/27/22.  Negative Gyn exam except for very mild blood on the vaginal mucosa.  S/P Vaginal Hysterectomy.  Menopause, well on no HRT.  No pelvic pain.  Abstinent. Nothing inserted in the vagina per patient.  No UTI Sx, no blood in urine.  BMs normal.  No blood in stool.   2. S/P total hysterectomy  3. Genitourinary syndrome of menopause Possible vaginal spotting secondary to atrophy of menopause.  No CI to Estradiol cream vaginally.  Estradiol cream 1 applicator  twice a week with a thin layer on the vulva.  May start with 1/2 an applicator daily x 2 weeks.  Prescription sent to pharmacy.  Counseling done and recommendations to be reassess if persistence of vaginal bleeding after 4 weeks of Estradiol cream treatment.  Patient voiced understanding and agreement.  Other orders - estradiol (ESTRACE) 0.1 MG/GM vaginal cream; Place 1 Applicatorful vaginally 2 (two) times a week.   Genia Del MD, 1:55 PM 11/30/2022

## 2022-12-01 ENCOUNTER — Other Ambulatory Visit: Payer: Self-pay

## 2022-12-01 MED ORDER — METHIMAZOLE 5 MG PO TABS: 5.0000 mg | ORAL_TABLET | Freq: Every day | ORAL | 3 refills | Status: DC

## 2022-12-10 ENCOUNTER — Other Ambulatory Visit: Payer: Self-pay

## 2022-12-10 MED ORDER — JANUMET 50-1000 MG PO TABS: 1.0000 | ORAL_TABLET | Freq: Two times a day (BID) | ORAL | 1 refills | Status: DC

## 2022-12-10 MED ORDER — METHIMAZOLE 5 MG PO TABS: 5.0000 mg | ORAL_TABLET | Freq: Every day | ORAL | 1 refills | Status: DC

## 2022-12-30 ENCOUNTER — Other Ambulatory Visit (INDEPENDENT_AMBULATORY_CARE_PROVIDER_SITE_OTHER): Payer: Managed Care, Other (non HMO)

## 2022-12-30 DIAGNOSIS — E0590 Thyrotoxicosis, unspecified without thyrotoxic crisis or storm: Secondary | ICD-10-CM

## 2022-12-30 LAB — T4, FREE: Free T4: 0.71 ng/dL (ref 0.60–1.60)

## 2022-12-30 LAB — TSH: TSH: 2.72 u[IU]/mL (ref 0.35–5.50)

## 2022-12-30 LAB — T3, FREE: T3, Free: 3.1 pg/mL (ref 2.3–4.2)

## 2023-03-04 ENCOUNTER — Emergency Department (HOSPITAL_BASED_OUTPATIENT_CLINIC_OR_DEPARTMENT_OTHER)
Admission: EM | Admit: 2023-03-04 | Discharge: 2023-03-05 | Disposition: A | Discharge status: Home / Self Care | Payer: Managed Care, Other (non HMO) | Attending: Emergency Medicine | Admitting: Emergency Medicine

## 2023-03-04 ENCOUNTER — Other Ambulatory Visit: Payer: Self-pay

## 2023-03-04 ENCOUNTER — Emergency Department (HOSPITAL_BASED_OUTPATIENT_CLINIC_OR_DEPARTMENT_OTHER): Payer: Managed Care, Other (non HMO)

## 2023-03-04 ENCOUNTER — Encounter (HOSPITAL_BASED_OUTPATIENT_CLINIC_OR_DEPARTMENT_OTHER): Payer: Self-pay

## 2023-03-04 DIAGNOSIS — R112 Nausea with vomiting, unspecified: Secondary | ICD-10-CM | POA: Diagnosis not present

## 2023-03-04 DIAGNOSIS — Z7984 Long term (current) use of oral hypoglycemic drugs: Secondary | ICD-10-CM | POA: Diagnosis not present

## 2023-03-04 DIAGNOSIS — R1011 Right upper quadrant pain: Secondary | ICD-10-CM | POA: Diagnosis present

## 2023-03-04 DIAGNOSIS — E119 Type 2 diabetes mellitus without complications: Secondary | ICD-10-CM | POA: Diagnosis not present

## 2023-03-04 DIAGNOSIS — R197 Diarrhea, unspecified: Secondary | ICD-10-CM | POA: Insufficient documentation

## 2023-03-04 DIAGNOSIS — Z79899 Other long term (current) drug therapy: Secondary | ICD-10-CM | POA: Insufficient documentation

## 2023-03-04 LAB — URINALYSIS, ROUTINE W REFLEX MICROSCOPIC
Bilirubin Urine: NEGATIVE
Glucose, UA: >1000 mg/dL — AB
Hgb urine dipstick: NEGATIVE
Ketones, ur: NEGATIVE mg/dL
Leukocytes,Ua: NEGATIVE
Nitrite: NEGATIVE
Specific Gravity, Urine: <1.005 — ABNORMAL LOW (ref 1.005–1.030)
pH: 5 (ref 5.0–8.0)

## 2023-03-04 LAB — COMPREHENSIVE METABOLIC PANEL
ALT: 26 U/L (ref 0–44)
AST: 28 U/L (ref 15–41)
Albumin: 5.4 g/dL — ABNORMAL HIGH (ref 3.5–5.0)
Alkaline Phosphatase: 63 U/L (ref 38–126)
Anion gap: 13 (ref 5–15)
BUN: 10 mg/dL (ref 8–23)
CO2: 28 mmol/L (ref 22–32)
Calcium: 11.1 mg/dL — ABNORMAL HIGH (ref 8.9–10.3)
Chloride: 95 mmol/L — ABNORMAL LOW (ref 98–111)
Creatinine, Ser: 0.9 mg/dL (ref 0.44–1.00)
GFR, Estimated: >60 mL/min (ref >60)
Glucose, Bld: 150 mg/dL — ABNORMAL HIGH (ref 70–99)
Potassium: 3.6 mmol/L (ref 3.5–5.1)
Sodium: 136 mmol/L (ref 135–145)
Total Bilirubin: 0.8 mg/dL (ref 0.3–1.2)
Total Protein: 8.4 g/dL — ABNORMAL HIGH (ref 6.5–8.1)

## 2023-03-04 LAB — CBC
HCT: 33 % — ABNORMAL LOW (ref 36.0–46.0)
Hemoglobin: 10.6 g/dL — ABNORMAL LOW (ref 12.0–15.0)
MCH: 26 pg (ref 26.0–34.0)
MCHC: 32.1 g/dL (ref 30.0–36.0)
MCV: 81.1 fL (ref 80.0–100.0)
Platelets: 353 10*3/uL (ref 150–400)
RBC: 4.07 MIL/uL (ref 3.87–5.11)
RDW: 13.5 % (ref 11.5–15.5)
WBC: 8.5 10*3/uL (ref 4.0–10.5)
nRBC: 0 % (ref 0.0–0.2)

## 2023-03-04 LAB — LIPASE, BLOOD: Lipase: 83 U/L — ABNORMAL HIGH (ref 11–51)

## 2023-03-04 MED ORDER — ONDANSETRON 4 MG PO TBDP
4.0000 mg | ORAL_TABLET | Freq: Once | ORAL | Status: AC | PRN
  Administered 2023-03-04: 4 mg via ORAL
  Filled 2023-03-04: qty 1

## 2023-03-04 MED ORDER — SODIUM CHLORIDE 0.9 % IV BOLUS
1000.0000 mL | Freq: Once | INTRAVENOUS | Status: AC
  Administered 2023-03-04: 1000 mL via INTRAVENOUS

## 2023-03-04 MED ORDER — PROCHLORPERAZINE EDISYLATE 10 MG/2ML IJ SOLN
5.0000 mg | Freq: Once | INTRAMUSCULAR | Status: AC
  Administered 2023-03-04: 5 mg via INTRAVENOUS
  Filled 2023-03-04: qty 2

## 2023-03-04 MED ORDER — FENTANYL CITRATE PF 50 MCG/ML IJ SOSY
50.0000 ug | PREFILLED_SYRINGE | Freq: Once | INTRAMUSCULAR | Status: AC
  Administered 2023-03-04: 50 ug via INTRAVENOUS
  Filled 2023-03-04: qty 1

## 2023-03-04 MED ORDER — IOHEXOL 300 MG/ML  SOLN
100.0000 mL | Freq: Once | INTRAMUSCULAR | Status: AC | PRN
  Administered 2023-03-05: 100 mL via INTRAVENOUS

## 2023-03-04 NOTE — ED Triage Notes (Signed)
Pt presents with upper abd pain with  N/V/D that started 4 days ago. Pt reports 6 episodes of vomiting in the last 24 hours.

## 2023-03-04 NOTE — ED Provider Notes (Signed)
[place] EMERGENCY DEPARTMENT AT [facility] Provider Note  CSN: [id number] Arrival date & time: 03/04/23 1731  Chief Complaint(s) Abdominal Pain  HPI Janet Kelley is a 62 y.o. female {Add pertinent medical, surgical, social history, OB history to HPI:1}   The history is provided by the patient.  Abdominal Pain Pain location:  RUQ Pain quality: aching and sharp   Pain radiates to:  Does not radiate Pain severity:  Moderate Onset quality:  Gradual Duration:  5 days Timing:  Constant Progression:  Waxing and waning Relieved by:  Nothing Worsened by:  Movement, palpation and eating Associated symptoms: diarrhea, nausea and vomiting   Associated symptoms: no chest pain, no cough, no dysuria and no fever     Past Medical History Past Medical History:  Diagnosis Date   ADHD (attention deficit hyperactivity disorder)    Anxiety    Cervical dysplasia    Diabetes mellitus    Type 2   Elevated cholesterol    Endometriosis    Fibroid    GERD (gastroesophageal reflux disease)    Hyperthyroidism    Tendonitis, Achilles    Vitamin D deficiency 05/2013   Patient Active Problem List   Diagnosis Date Noted   Thyroid nodule 10/21/2022   Cholelithiasis with chronic cholecystitis 11/22/2021   Pain due to onychomycosis of toenails of both feet 07/13/2019   Diabetes (HCC) 07/13/2019   Hyperthyroidism 07/09/2019   History of vitamin D deficiency 10/24/2015   History of thyroid cyst 05/09/2015   Elevated cholesterol    Anxiety    Cervical dysplasia    Endometriosis    SVT (supraventricular tachycardia) 09/01/2011   Home Medication(s) Prior to Admission medications   Medication Sig Start Date End Date Taking? Authorizing Provider  acetaminophen (TYLENOL) 500 MG tablet Take 1,000 mg by mouth every 6 (six) hours as needed (pain).    [provider]  busPIRone (BUSPAR) 10 MG tablet Take 20 mg by mouth 2 (two) times daily. 10/27/21   [provider]   Cholecalciferol (VITAMIN D3 PO) Take 1 tablet by mouth daily.    [provider]  citalopram (CELEXA) 40 MG tablet Take 40 mg by mouth every evening. 08/16/11   [provider]  clonazePAM (KLONOPIN) 0.5 MG tablet Take 0.5 mg by mouth in the morning and at bedtime. 07/15/11   [provider]  empagliflozin (JARDIANCE) 10 MG TABS tablet Take 1 tablet (10 mg total) by mouth daily. 04/22/22   Carlus Pavlov, MD  estradiol (ESTRACE) 0.1 MG/GM vaginal cream Place 1 Applicatorful vaginally 2 (two) times a week. 12/02/22   Genia Del, MD  ezetimibe (ZETIA) 10 MG tablet Take 10 mg by mouth at bedtime.     [provider]  fexofenadine (ALLEGRA) 180 MG tablet Take 180 mg by mouth in the morning.    [provider]  JANUMET 50-1000 MG tablet Take 1 tablet by mouth 2 (two) times daily. 12/10/22   Carlus Pavlov, MD  lisdexamfetamine (VYVANSE) 70 MG capsule Take 70 mg by mouth in the morning.    [provider]  loperamide (IMODIUM) 2 MG capsule Take 2-4 mg by mouth 4 (four) times daily as needed for diarrhea or loose stools.    [provider]  losartan (COZAAR) 25 MG tablet Take 25 mg by mouth in the morning.    [provider]  methimazole (TAPAZOLE) 5 MG tablet Take 1 tablet (5 mg total) by mouth daily. 12/10/22   Carlus Pavlov, MD  Multiple  Vitamin (MULTIVITAMIN WITH MINERALS) TABS tablet Take 1 tablet by mouth daily.    [provider]  omeprazole (PRILOSEC) 40 MG capsule Take 40 mg by mouth in the morning and at bedtime.    [provider]  ondansetron (ZOFRAN-ODT) 4 MG disintegrating tablet Take 4 mg by mouth every 8 (eight) hours as needed for nausea or vomiting.    [provider]  potassium chloride (KLOR-CON) 10 MEQ tablet Take 10 mEq by mouth every evening.    [provider]  rosuvastatin (CRESTOR) 40 MG tablet Take 40 mg by mouth at bedtime.     [provider]   tiZANidine (ZANAFLEX) 4 MG tablet Take 1 tablet (4 mg total) by mouth every 6 (six) hours as needed for muscle spasms. Do not take while driving or operating heavy machinery. 08/14/21   Valentino Nose, NP  traMADol (ULTRAM) 50 MG tablet Take 1-2 tablets (50-100 mg total) by mouth every 6 (six) hours as needed for moderate pain. 11/23/21   Darnell Level, MD                                                                                                                                    Allergies Dapagliflozin, Diclofenac, Liraglutide, Lisinopril, Nsaids, and Ceclor [cefaclor]  Review of Systems Review of Systems  Constitutional:  Negative for fever.  Respiratory:  Negative for cough.   Cardiovascular:  Negative for chest pain.  Gastrointestinal:  Positive for abdominal pain, diarrhea, nausea and vomiting.  Genitourinary:  Negative for dysuria.   As noted in HPI  Physical Exam Vital Signs  I have reviewed the triage vital signs BP (!) 140/77   Pulse 98   Temp 97.9 F (36.6 C) (Oral)   Resp 18   Ht 5\' 1"  (1.549 m)   Wt 65.3 kg   SpO2 100%   BMI 27.21 kg/m  *** Physical Exam Vitals reviewed.  Constitutional:      General: She is not in acute distress.    Appearance: She is well-developed. She is not diaphoretic.  HENT:     Head: Normocephalic and atraumatic.     Right Ear: External ear normal.     Left Ear: External ear normal.     Nose: Nose normal.  Eyes:     General: No scleral icterus.    Conjunctiva/sclera: Conjunctivae normal.  Neck:     Trachea: Phonation normal.  Cardiovascular:     Rate and Rhythm: Normal rate and regular rhythm.  Pulmonary:     Effort: Pulmonary effort is normal. No respiratory distress.     Breath sounds: No stridor.  Abdominal:     General: There is no distension.     Tenderness: There is abdominal tenderness in the right upper quadrant. There is no guarding or rebound. Negative signs include Murphy's sign.  Musculoskeletal:         General: Normal range of motion.  Cervical back: Normal range of motion.  Neurological:     Mental Status: She is alert and oriented to person, place, and time.  Psychiatric:        Behavior: Behavior normal.     ED Results and Treatments Labs (all labs ordered are listed, but only abnormal results are displayed) Labs Reviewed  LIPASE, BLOOD - Abnormal; Notable for the following components:      Result Value   Lipase 83 (*)    All other components within normal limits  COMPREHENSIVE METABOLIC PANEL - Abnormal; Notable for the following components:   Chloride 95 (*)    Glucose, Bld 150 (*)    Calcium 11.1 (*)    Total Protein 8.4 (*)    Albumin 5.4 (*)    All other components within normal limits  CBC - Abnormal; Notable for the following components:   Hemoglobin 10.6 (*)    HCT 33.0 (*)    All other components within normal limits  URINALYSIS, ROUTINE W REFLEX MICROSCOPIC - Abnormal; Notable for the following components:   Color, Urine COLORLESS (*)    Specific Gravity, Urine <1.005 (*)    Glucose, UA >1,000 (*)    Protein, ur TRACE (*)    Bacteria, UA RARE (*)    All other components within normal limits                                                                                                                         EKG  EKG Interpretation Date/Time:    Ventricular Rate:    PR Interval:    QRS Duration:    QT Interval:    QTC Calculation:   R Axis:      Text Interpretation:         Radiology No results found.  Medications Ordered in ED Medications  ondansetron (ZOFRAN-ODT) disintegrating tablet 4 mg (4 mg Oral Given 03/04/23 1758)   Procedures Procedures  (including critical care time) Medical Decision Making / ED Course   Medical Decision Making Amount and/or Complexity of Data Reviewed Labs: ordered. Decision-making details documented in ED Course.  Risk Prescription drug management.    ***    Final Clinical Impression(s) / ED  Diagnoses Final diagnoses:  None    This chart was dictated using voice recognition software.  Despite best efforts to proofread,  errors can occur which can change the documentation meaning.

## 2023-03-05 MED ORDER — ONDANSETRON 4 MG PO TBDP: 4.0000 mg | ORAL_TABLET | Freq: Three times a day (TID) | ORAL | 0 refills | Status: AC | PRN

## 2023-03-05 NOTE — ED Notes (Signed)
She is fully awake, alert and oriented x 4.

## 2023-03-05 NOTE — ED Notes (Signed)
Water at bedside  

## 2023-04-22 ENCOUNTER — Ambulatory Visit: Payer: Managed Care, Other (non HMO) | Admitting: Internal Medicine

## 2023-04-22 ENCOUNTER — Encounter: Payer: Self-pay | Admitting: Internal Medicine

## 2023-04-22 VITALS — BP 122/70 | HR 124 | Ht 61.0 in | Wt 139.0 lb

## 2023-04-22 DIAGNOSIS — E041 Nontoxic single thyroid nodule: Secondary | ICD-10-CM | POA: Diagnosis not present

## 2023-04-22 DIAGNOSIS — E119 Type 2 diabetes mellitus without complications: Secondary | ICD-10-CM

## 2023-04-22 DIAGNOSIS — Z7984 Long term (current) use of oral hypoglycemic drugs: Secondary | ICD-10-CM

## 2023-04-22 DIAGNOSIS — E0590 Thyrotoxicosis, unspecified without thyrotoxic crisis or storm: Secondary | ICD-10-CM | POA: Diagnosis not present

## 2023-04-22 LAB — POCT GLYCOSYLATED HEMOGLOBIN (HGB A1C): Hemoglobin A1C: 6.6 % — AB (ref 4.0–5.6)

## 2023-04-22 MED ORDER — EMPAGLIFLOZIN 10 MG PO TABS: 10.0000 mg | ORAL_TABLET | Freq: Every day | ORAL | 3 refills | Status: DC

## 2023-04-22 MED ORDER — JANUMET 50-1000 MG PO TABS: 1.0000 | ORAL_TABLET | Freq: Two times a day (BID) | ORAL | 3 refills | Status: DC

## 2023-04-22 NOTE — Progress Notes (Unsigned)
Patient ID: Janet Kelley, female   DOB: [birth year], 61 y.o.   MRN: [id number]  HPI: Janet Kelley is a 62 y.o.-year-old female, returning for follow-up for DM2, dx in 2009, non-insulin-dependent, controlled, without long-term complications and also, thyroid nodule and thyrotoxicosis. Pt. previously saw Dr. Everardo All, but last visit with me 6 months ago.  Interim hx: No increased urination, blurry vision, chest pain. However, at today's visit, she mentions nausea/vomiting, diarrhea, AP - this the second episode after 02/2023 (went to the ED). CT abd was normal at that time.  She just saw her PCP Deboraha Sprang) and labs are pending.  She was given Phenergan.  DM2: Reviewed HbA1c: Lab Results  Component Value Date   HGBA1C 6.1 (A) 10/21/2022   HGBA1C 6.1 (A) 04/22/2022   HGBA1C 6.1 (H) 11/11/2021   HGBA1C 6.2 09/21/2021   HGBA1C 6.4 (A) 06/23/2021   HGBA1C 6.5 (A) 12/19/2020   HGBA1C 6.8 (A) 09/16/2020   HGBA1C 6.3 (A) 06/17/2020   HGBA1C 6.6 (A) 04/15/2020   HGBA1C 6.3 (A) 02/19/2020   HGBA1C 6.3 (A) 12/14/2019   HGBA1C 6.7 (A) 10/12/2019   HGBA1C 6.1 07/12/2019   Pt is on a regimen of: - Metformin ER-Sitagliptin 1000-50 mg 2x a day - Jardiance 10 mg before breakfast >> at night >> am She previously tried Byetta, Victoza, Trulicity and they cause nausea. Jardiance caused vaginitis in the past, not tolerated well.  Pt checks her sugars 0-1x a day and they are: - am: 110-120 >> 90-125 >> 73-130, 183 - 2h after b'fast: n/c - before lunch: n/c - 2h after lunch: n/c - before dinner: n/c - 2h after dinner: n/c >> 91-123 - bedtime: n/c - nighttime: n/c Lowest sugar was 90s >> 90 >> 73; she has hypoglycemia awareness at 70.  Highest sugar was 200 >> 130 >> 183  Glucometer:Freestyle  - no CKD, last BUN/creatinine:  Lab Results  Component Value Date   BUN 10 03/04/2023   Lab Results  Component Value Date   CREATININE 0.90 03/04/2023   No results found for:  "MICRALBCREAT" 07/30/2022: ACR 69 07/28/2021: ACR 90.9 She is on Cozaar 25 mg.  -+ HL; last set of lipids: 07/30/2022: 138/77/53/70 07/28/2021: 139/68/49/77 Lipid Panel  No results found for: "CHOL", "TRIG", "HDL", "CHOLHDL", "VLDL", "LDLCALC", "LDLDIRECT", "LABVLDL" On Crestor 40 mg daily and Zetia 10 mg daily.  - last eye exam was in 2024. No DR reportedly.   - no numbness and tingling in her feet. Last foot exam 10/21/2022.  Thyroid nodule:  Patient was diagnosed with a thyroid nodule in 1995, and a biopsy returned benign in the same year, reportedly.  Pt denies: - feeling nodules in neck - hoarseness - dysphagia - choking  Thyrotoxicosis:  She was diagnosed with thyrotoxicosis in 2021.  She did not have imaging for this.  In 10/2022, she was on methimazole 10 mg daily.  She was off metoprolol. At that time, we decreased the methimazole dose to 5 mg daily.  Reviewed her TFTs: Lab Results  Component Value Date   TSH 2.72 12/30/2022   TSH 5.78 (H) 10/21/2022   TSH 1.56 04/22/2022   TSH 2.91 09/21/2021   TSH 3.12 06/23/2021   TSH 2.54 03/05/2021   TSH 1.99 12/19/2020   TSH 4.50 06/17/2020   TSH 0.57 04/15/2020   TSH 18.81 (H) 02/19/2020   TSH 8.64 (H) 12/14/2019   TSH <0.01 (L) 10/12/2019   TSH <0.01 (L) 08/10/2019   TSH <0.01 (L) 07/09/2019  TSH <0.010 (L) 06/12/2019   TSH 1.059 05/09/2015   FREET4 0.71 12/30/2022   T3FREE 3.1 12/30/2022   T3FREE 2.6 10/21/2022   T3FREE 2.8 04/22/2022   No FH of thyroid cancer. No h/o radiation tx to head or neck. No recent contrast studies. No herbal supplements. No Biotin use. No recent steroids use.   She has a history of vitamin D deficiency - on vitamin D3 2000 units, fibroids, endometriosis.  ROS: + see HPI  Past Medical History:  Diagnosis Date   ADHD (attention deficit hyperactivity disorder)    Anxiety    Cervical dysplasia    Diabetes mellitus    Type 2   Elevated cholesterol    Endometriosis     Fibroid    GERD (gastroesophageal reflux disease)    Hyperthyroidism    Tendonitis, Achilles    Vitamin D deficiency 05/2013   Past Surgical History:  Procedure Laterality Date   CERVICAL DISC SURGERY     CHOLECYSTECTOMY N/A 11/23/2021   Procedure: LAPAROSCOPIC CHOLECYSTECTOMY WITH INTRAOPERATIVE CHOLANGIOGRAM;  Surgeon: Darnell Level, MD;  Location: [facility];  Service: General;  Laterality: N/A;   COLPOSCOPY     GYNECOLOGIC CRYOSURGERY     KNEE SURGERY Left    Arthroscopic   PELVIC LAPAROSCOPY     DL laser endometriosis   VAGINAL HYSTERECTOMY  06/08/2007   [facility]   Social History   Socioeconomic History   Marital status: Single    Spouse name: Not on file   Number of children: Not on file   Years of education: Not on file   Highest education level: Not on file  Occupational History   Not on file  Tobacco Use   Smoking status: Former    Current packs/day: 0.00    Types: Cigarettes    Quit date: 03/16/2006    Years since quitting: 17.1   Smokeless tobacco: Never  Vaping Use   Vaping status: Never Used  Substance and Sexual Activity   Alcohol use: Not Currently    Comment: occasiaonal   Drug use: No   Sexual activity: Not Currently    Partners: Male    Birth control/protection: Surgical    Comment: 1st intercourse- 18, partners- greater than 5  Other Topics Concern   Not on file  Social History Narrative   Not on file   Social Determinants of Health   Financial Resource Strain: Not on file  Food Insecurity: Not on file  Transportation Needs: Not on file  Physical Activity: Not on file  Stress: Not on file  Social Connections: Not on file  Intimate Partner Violence: Not on file   Current Outpatient Medications on File Prior to Visit  Medication Sig Dispense Refill   acetaminophen (TYLENOL) 500 MG tablet Take 1,000 mg by mouth every 6 (six) hours as needed (pain).     busPIRone (BUSPAR) 10 MG tablet Take 20 mg by mouth 2 (two) times daily.     Cholecalciferol  (VITAMIN D3 PO) Take 1 tablet by mouth daily.     citalopram (CELEXA) 40 MG tablet Take 40 mg by mouth every evening.     clonazePAM (KLONOPIN) 0.5 MG tablet Take 0.5 mg by mouth in the morning and at bedtime.     empagliflozin (JARDIANCE) 10 MG TABS tablet Take 1 tablet (10 mg total) by mouth daily. 90 tablet 3   estradiol (ESTRACE) 0.1 MG/GM vaginal cream Place 1 Applicatorful vaginally 2 (two) times a week. 42.5 g 4   ezetimibe (ZETIA) 10  MG tablet Take 10 mg by mouth at bedtime.      fexofenadine (ALLEGRA) 180 MG tablet Take 180 mg by mouth in the morning.     JANUMET 50-1000 MG tablet Take 1 tablet by mouth 2 (two) times daily. 180 tablet 1   lisdexamfetamine (VYVANSE) 70 MG capsule Take 70 mg by mouth in the morning.     loperamide (IMODIUM) 2 MG capsule Take 2-4 mg by mouth 4 (four) times daily as needed for diarrhea or loose stools.     losartan (COZAAR) 25 MG tablet Take 25 mg by mouth in the morning.     methimazole (TAPAZOLE) 5 MG tablet Take 1 tablet (5 mg total) by mouth daily. 45 tablet 1   Multiple Vitamin (MULTIVITAMIN WITH MINERALS) TABS tablet Take 1 tablet by mouth daily.     omeprazole (PRILOSEC) 40 MG capsule Take 40 mg by mouth in the morning and at bedtime.     potassium chloride (KLOR-CON) 10 MEQ tablet Take 10 mEq by mouth every evening.     rosuvastatin (CRESTOR) 40 MG tablet Take 40 mg by mouth at bedtime.      tiZANidine (ZANAFLEX) 4 MG tablet Take 1 tablet (4 mg total) by mouth every 6 (six) hours as needed for muscle spasms. Do not take while driving or operating heavy machinery. 30 tablet 0   traMADol (ULTRAM) 50 MG tablet Take 1-2 tablets (50-100 mg total) by mouth every 6 (six) hours as needed for moderate pain. 15 tablet 0   No current facility-administered medications on file prior to visit.   Allergies  Allergen Reactions   Dapagliflozin     Other Reaction(s): yeast infection   Diclofenac     Other Reaction(s): Stomach upset   Liraglutide     Other  Reaction(s): GI   Lisinopril     Other Reaction(s): did not tolerate/ CP   Nsaids Nausea Only   Ceclor [Cefaclor] Swelling and Rash   Family History  Problem Relation Age of Onset   Hypertension Mother    Heart disease Mother    Diabetes Mother    Cancer Mother        throat cancer   Hyperthyroidism Father    Hypertension Sister    Heart disease Paternal Uncle    Colon cancer Paternal Grandmother    Colon cancer Cousin        STOMACH   PE: BP 122/70   Pulse (!) 124   Ht 5\' 1"  (1.549 m)   Wt 139 lb (63 kg)   SpO2 98%   BMI 26.26 kg/m  Wt Readings from Last 3 Encounters:  04/22/23 139 lb (63 kg)  03/04/23 144 lb (65.3 kg)  11/16/22 154 lb (69.9 kg)    Constitutional: normal weight, in NAD Eyes:  EOMI, no exophthalmos ENT: no neck masses, no cervical lymphadenopathy Cardiovascular: Tachycardia, RR, No MRG Respiratory: CTA B Musculoskeletal: no deformities Skin:no rashes Neurological: no tremor with outstretched hands  ASSESSMENT: 1. DM2, non-insulin-dependent, uncontrolled, without long-term complications  2.  Thyroid nodule  3.  Thyrotoxicosis  PLAN:  1. Patient with longstanding, uncontrolled, type 2 diabetes, on oral antidiabetic regimen with metformin, DPP 4 inhibitor and SGLT2 inhibitor.  At last visit, HbA1c was excellent, at 6.1%.  She was taking the Jardiance at night and we discussed about moving it in the morning but otherwise we did not change the regimen. -At today's visit, sugars remain mostly at goal with 1 exception in the 180s in the morning.  HbA1c appears to be higher, but still at goal.  For now, my suggestion was to continue the current regimen but as she has nausea and vomiting, and she appears to be dehydrated today, I advised her to hold Jardiance for 3 days.  I also advised her to stay well-hydrated.   -She does not feel that her symptoms are related to metformin. - I suggested to:  Patient Instructions  Please continue: - Metformin  ER-Sitagliptin 1000-50 mg 2x a daily - Jardiance 10 mg before breakfast (hold this for 3 days)  Also, continue: - Methimazole 5 mg daily  Please stop at the lab.  Please return for another visit in 4 months.  - we checked her HbA1c: 6.6% (higher) - advised to check sugars at different times of the day - 1x a day, rotating check times - advised for yearly eye exams >> she is UTD - return to clinic in 3-4 months  2.  Thyroid nodule -Diagnosed in 1995 -She had a benign biopsy reportedly 1995 -No neck compression symptoms or masses felt on palpation of her neck today -On CT angiography from 2021, thyroid appears to be remarkable -Will continue to follow her clinically but discussed with her about letting me know if she develops neck compression symptoms, in which case, we will check another ultrasound  3.  Thyrotoxicosis -Diagnosed in 2021 -Controlled on methimazole, at last visit 10 mg daily but decrease to 5 mg daily as the TSH returned elevated, at 5.78 -Subsequent TSH was normal: Lab Results  Component Value Date   TSH 2.72 12/30/2022  -She tolerates methimazole well -Will repeat her TSH today and change the methimazole dose accordingly  Needs refills of MMI.  Carlus Pavlov, MD PhD [facility] Endocrinology

## 2023-04-22 NOTE — Patient Instructions (Addendum)
Please continue: - Metformin ER-Sitagliptin 1000-50 mg 2x a daily - Jardiance 10 mg before breakfast (hold this for 3 days)  Also, continue: - Methimazole 5 mg daily  Please stop at the lab.  Please return for another visit in 4 months.

## 2023-04-23 LAB — T4, FREE: Free T4: 1.3 ng/dL (ref 0.8–1.8)

## 2023-04-23 LAB — TSH: TSH: 1.66 m[IU]/L (ref 0.40–4.50)

## 2023-04-23 LAB — T3, FREE: T3, Free: 2.3 pg/mL (ref 2.3–4.2)

## 2023-04-25 ENCOUNTER — Encounter: Payer: Self-pay | Admitting: Internal Medicine

## 2023-04-25 MED ORDER — METHIMAZOLE 5 MG PO TABS: 5.0000 mg | ORAL_TABLET | Freq: Every day | ORAL | 3 refills | Status: DC

## 2023-09-01 ENCOUNTER — Encounter: Payer: Self-pay | Admitting: Internal Medicine

## 2023-09-01 ENCOUNTER — Ambulatory Visit (INDEPENDENT_AMBULATORY_CARE_PROVIDER_SITE_OTHER): Payer: Managed Care, Other (non HMO) | Admitting: Internal Medicine

## 2023-09-01 VITALS — BP 112/60 | HR 104 | Ht 61.0 in | Wt 146.8 lb

## 2023-09-01 DIAGNOSIS — E0590 Thyrotoxicosis, unspecified without thyrotoxic crisis or storm: Secondary | ICD-10-CM

## 2023-09-01 DIAGNOSIS — E041 Nontoxic single thyroid nodule: Secondary | ICD-10-CM | POA: Diagnosis not present

## 2023-09-01 DIAGNOSIS — Z7984 Long term (current) use of oral hypoglycemic drugs: Secondary | ICD-10-CM

## 2023-09-01 DIAGNOSIS — E119 Type 2 diabetes mellitus without complications: Secondary | ICD-10-CM | POA: Diagnosis not present

## 2023-09-01 MED ORDER — JANUMET 50-1000 MG PO TABS: 1.0000 | ORAL_TABLET | Freq: Two times a day (BID) | ORAL | 3 refills | Status: AC

## 2023-09-01 MED ORDER — EMPAGLIFLOZIN 10 MG PO TABS: 10.0000 mg | ORAL_TABLET | Freq: Every day | ORAL | 3 refills | Status: AC

## 2023-09-01 NOTE — Progress Notes (Signed)
 Patient ID: Janet Kelley, female   DOB: [birth year], 63 y.o.   MRN: [id number]  HPI: Janet Kelley is a 63 y.o.-year-old female, returning for follow-up for DM2, dx in 2009, non-insulin-dependent, controlled, without long-term complications and also, thyroid nodule and thyrotoxicosis. Pt. previously saw Dr. Everardo All, but last visit with me 4 months ago.  Interim hx: No increased urination, blurry vision, chest pain. She lost her job in 04/2023.  She changed her insurance to [facility].  She is looking for another job.  DM2: Reviewed HbA1c: Lab Results  Component Value Date   HGBA1C 6.6 (A) 04/22/2023   HGBA1C 6.1 (A) 10/21/2022   HGBA1C 6.1 (A) 04/22/2022   HGBA1C 6.1 (H) 11/11/2021   HGBA1C 6.2 09/21/2021   HGBA1C 6.4 (A) 06/23/2021   HGBA1C 6.5 (A) 12/19/2020   HGBA1C 6.8 (A) 09/16/2020   HGBA1C 6.3 (A) 06/17/2020   HGBA1C 6.6 (A) 04/15/2020   HGBA1C 6.3 (A) 02/19/2020   HGBA1C 6.3 (A) 12/14/2019   HGBA1C 6.7 (A) 10/12/2019   HGBA1C 6.1 07/12/2019   Pt is on a regimen of: - Metformin ER-Sitagliptin 1000-50 mg 2x a day - Jardiance 10 mg before breakfast >> at night >> am She previously tried Byetta, Victoza, Trulicity and they cause nausea. Jardiance caused vaginitis in the past, now tolerated well.  Pt checks her sugars 0-1x a day and they are: - am: 110-120 >> 90-125 >> 73-130, 183 >> 90-120 - 2h after b'fast: n/c - before lunch: n/c >> 70s-130 - 2h after lunch: n/c - before dinner: n/c - 2h after dinner: n/c >> 91-123 >> n/c - bedtime: n/c >> up to 170 (snack) - nighttime: n/c Lowest sugar was 90s >> 90 >> 73 >> 70s; she has hypoglycemia awareness at 70.  Highest sugar was 200 >> 130 >> 183 >> 180  Glucometer:Freestyle  - no CKD, last BUN/creatinine:  05/02/2023: 9/0.9, GFR 69, glucose 181 Lab Results  Component Value Date   BUN 10 03/04/2023   Lab Results  Component Value Date   CREATININE 0.90 03/04/2023   No results found for:  "MICRALBCREAT" 07/30/2022: ACR 69 07/28/2021: ACR 90.9 She is on Cozaar 25 mg.  -+ HL; last set of lipids: 07/30/2022: 138/77/53/70 07/28/2021: 139/68/49/77 Lipid Panel  No results found for: "CHOL", "TRIG", "HDL", "CHOLHDL", "VLDL", "LDLCALC", "LDLDIRECT", "LABVLDL" On Crestor 40 mg daily and Zetia 10 mg daily.  - last eye exam was in 2024. No DR reportedly.   - no numbness and tingling in her feet. Last foot exam 10/21/2022.  Thyroid nodule:  Patient was diagnosed with a thyroid nodule in 1995, and a biopsy returned benign in the same year, reportedly.  Pt denies: - feeling nodules in neck - hoarseness - dysphagia - choking  Thyrotoxicosis:  She was diagnosed with thyrotoxicosis in 2021.  She did not have imaging for this.  In 10/2022, she was on methimazole 10 mg daily.  She was off metoprolol. At that time, we decreased the methimazole dose to 5 mg daily.  Reviewed her TFTs: Lab Results  Component Value Date   TSH 1.66 04/22/2023   TSH 2.72 12/30/2022   TSH 5.78 (H) 10/21/2022   TSH 1.56 04/22/2022   TSH 2.91 09/21/2021   TSH 3.12 06/23/2021   TSH 2.54 03/05/2021   TSH 1.99 12/19/2020   TSH 4.50 06/17/2020   TSH 0.57 04/15/2020   TSH 18.81 (H) 02/19/2020   TSH 8.64 (H) 12/14/2019   TSH <0.01 (L) 10/12/2019   TSH <0.01 (L)  08/10/2019   TSH <0.01 (L) 07/09/2019   TSH <0.010 (L) 06/12/2019   TSH 1.059 05/09/2015   FREET4 1.3 04/22/2023   T3FREE 2.3 04/22/2023   T3FREE 3.1 12/30/2022   T3FREE 2.6 10/21/2022   T3FREE 2.8 04/22/2022   No FH of thyroid cancer. No h/o radiation tx to head or neck. No recent contrast studies. No herbal supplements. No Biotin use. No recent steroids use.   She has a history of vitamin D deficiency - on vitamin D3 2000 units, fibroids, endometriosis. She had nausea/vomiting, diarrhea, AP - this the second episode after 02/2023 (went to the ED). CT abd was normal at that time. She was given Phenergan.  Her lipase was slightly  elevated: 03/04/2023: Lipase 83  ROS: + see HPI  Past Medical History:  Diagnosis Date   ADHD (attention deficit hyperactivity disorder)    Anxiety    Cervical dysplasia    Diabetes mellitus    Type 2   Elevated cholesterol    Endometriosis    Fibroid    GERD (gastroesophageal reflux disease)    Hyperthyroidism    Tendonitis, Achilles    Vitamin D deficiency 05/2013   Past Surgical History:  Procedure Laterality Date   CERVICAL DISC SURGERY     CHOLECYSTECTOMY N/A 11/23/2021   Procedure: LAPAROSCOPIC CHOLECYSTECTOMY WITH INTRAOPERATIVE CHOLANGIOGRAM;  Surgeon: Darnell Level, MD;  Location: [facility];  Service: General;  Laterality: N/A;   COLPOSCOPY     GYNECOLOGIC CRYOSURGERY     KNEE SURGERY Left    Arthroscopic   PELVIC LAPAROSCOPY     DL laser endometriosis   VAGINAL HYSTERECTOMY  06/08/2007   LAVH   Social History   Socioeconomic History   Marital status: Single    Spouse name: Not on file   Number of children: Not on file   Years of education: Not on file   Highest education level: Not on file  Occupational History   Not on file  Tobacco Use   Smoking status: Former    Current packs/day: 0.00    Types: Cigarettes    Quit date: 03/16/2006    Years since quitting: 17.4   Smokeless tobacco: Never  Vaping Use   Vaping status: Never Used  Substance and Sexual Activity   Alcohol use: Not Currently    Comment: occasiaonal   Drug use: No   Sexual activity: Not Currently    Partners: Male    Birth control/protection: Surgical    Comment: 1st intercourse- 18, partners- greater than 5  Other Topics Concern   Not on file  Social History Narrative   Not on file   Social Drivers of Health   Financial Resource Strain: Not on file  Food Insecurity: Not on file  Transportation Needs: Not on file  Physical Activity: Not on file  Stress: Not on file  Social Connections: Not on file  Intimate Partner Violence: Not on file   Current Outpatient Medications on  File Prior to Visit  Medication Sig Dispense Refill   acetaminophen (TYLENOL) 500 MG tablet Take 1,000 mg by mouth every 6 (six) hours as needed (pain).     busPIRone (BUSPAR) 10 MG tablet Take 20 mg by mouth 2 (two) times daily.     Cholecalciferol (VITAMIN D3 PO) Take 1 tablet by mouth daily.     citalopram (CELEXA) 40 MG tablet Take 40 mg by mouth every evening.     clonazePAM (KLONOPIN) 0.5 MG tablet Take 0.5 mg by mouth in the morning  and at bedtime.     empagliflozin (JARDIANCE) 10 MG TABS tablet Take 1 tablet (10 mg total) by mouth daily. 90 tablet 3   estradiol (ESTRACE) 0.1 MG/GM vaginal cream Place 1 Applicatorful vaginally 2 (two) times a week. 42.5 g 4   ezetimibe (ZETIA) 10 MG tablet Take 10 mg by mouth at bedtime.      fexofenadine (ALLEGRA) 180 MG tablet Take 180 mg by mouth in the morning.     JANUMET 50-1000 MG tablet Take 1 tablet by mouth 2 (two) times daily. 180 tablet 3   lisdexamfetamine (VYVANSE) 70 MG capsule Take 70 mg by mouth in the morning.     loperamide (IMODIUM) 2 MG capsule Take 2-4 mg by mouth 4 (four) times daily as needed for diarrhea or loose stools.     losartan (COZAAR) 25 MG tablet Take 25 mg by mouth in the morning.     methimazole (TAPAZOLE) 5 MG tablet Take 1 tablet (5 mg total) by mouth daily. 90 tablet 3   Multiple Vitamin (MULTIVITAMIN WITH MINERALS) TABS tablet Take 1 tablet by mouth daily.     omeprazole (PRILOSEC) 40 MG capsule Take 40 mg by mouth in the morning and at bedtime.     potassium chloride (KLOR-CON) 10 MEQ tablet Take 10 mEq by mouth every evening.     rosuvastatin (CRESTOR) 40 MG tablet Take 40 mg by mouth at bedtime.      tiZANidine (ZANAFLEX) 4 MG tablet Take 1 tablet (4 mg total) by mouth every 6 (six) hours as needed for muscle spasms. Do not take while driving or operating heavy machinery. 30 tablet 0   traMADol (ULTRAM) 50 MG tablet Take 1-2 tablets (50-100 mg total) by mouth every 6 (six) hours as needed for moderate pain. 15  tablet 0   No current facility-administered medications on file prior to visit.   Allergies  Allergen Reactions   Dapagliflozin     Other Reaction(s): yeast infection   Diclofenac     Other Reaction(s): Stomach upset   Liraglutide     Other Reaction(s): GI   Lisinopril     Other Reaction(s): did not tolerate/ CP   Nsaids Nausea Only   Ceclor [Cefaclor] Swelling and Rash   Family History  Problem Relation Age of Onset   Hypertension Mother    Heart disease Mother    Diabetes Mother    Cancer Mother        throat cancer   Hyperthyroidism Father    Hypertension Sister    Heart disease Paternal Uncle    Colon cancer Paternal Grandmother    Colon cancer Cousin        STOMACH   PE: BP 112/60   Pulse (!) 104   Ht 5\' 1"  (1.549 m)   Wt 146 lb 12.8 oz (66.6 kg)   SpO2 99%   BMI 27.74 kg/m  Wt Readings from Last 3 Encounters:  09/01/23 146 lb 12.8 oz (66.6 kg)  04/22/23 139 lb (63 kg)  03/04/23 144 lb (65.3 kg)    Constitutional: normal weight, in NAD Eyes:  EOMI, no exophthalmos ENT: no neck masses, no cervical lymphadenopathy Cardiovascular: tachycardia, RR, No MRG Respiratory: CTA B Musculoskeletal: no deformities Skin:no rashes Neurological: no tremor with outstretched hands Diabetic Foot Exam - Simple   Simple Foot Form Diabetic Foot exam was performed with the following findings: Yes 09/01/2023  4:10 PM  Visual Inspection No deformities, no ulcerations, no other skin breakdown bilaterally: Yes Sensation Testing  Intact to touch and monofilament testing bilaterally: Yes Pulse Check Posterior Tibialis and Dorsalis pulse intact bilaterally: Yes Comments Dry skin Thick nails 2nd toes B: hammer toe deformity    ASSESSMENT: 1. DM2, non-insulin-dependent, uncontrolled, without long-term complications  2. Thyroid nodule  3. Thyrotoxicosis  PLAN:  1. Patient with longstanding, uncontrolled, type 2 diabetes, on oral antidiabetic regimen with metformin, DPP 4  inhibitor and SGLT2 inhibitor.  HbA1c at last visit increased from 6.1% to 6.6% but sugars appears to be mostly at goal with only 1 exception, in the 180s in the morning.  We did not change the regimen at that time but she had an episode of nausea and vomiting and I advised her to hold Jardiance for 3 days and stay well-hydrated.  She did not go to the symptoms were related to metformin. -At today's visit, sugars appear to be mostly at goal despite several pound weight loss.  No need to change her regimen for now.  I refilled her prescriptions to [facility] (previously obtaining them from [facility]), but since last visit she changed her insurance (Medicaid). - I suggested to:  Patient Instructions  Please continue: - Metformin ER-Sitagliptin 1000-50 mg 2x a daily - Jardiance 10 mg before breakfast   Also, continue: - Methimazole 5 mg daily  Please stop at the lab.  Please return for another visit in 4 months.  - we checked her HbA1c: 6.5% (slightly lower) - advised to check sugars at different times of the day - 1x a day, rotating check times - advised for yearly eye exams >> she is UTD - return to clinic in 4 months  2.  Thyroid nodule -Diagnosed in 1995, status post benign biopsy reportedly in 1995 On the CT angiography from 2021, the thyroid appears to be unremarkable -She denies neck compression symptoms and she does not have masses felt on palpation of her neck -Will continue to follow her clinically for now  3.  Thyrotoxicosis -Diagnosed in 2021 -Controlled on methimazole, with a dose decreased from 10 mg daily to 5 mg daily after a TSH returned elevated, at 5.78 in 10/2022 -Latest TSH was normal: Lab Results  Component Value Date   TSH 1.66 04/22/2023  -She continues methimazole 5 mg daily tolerated well -Will repeat her TFTs today and change the methimazole dose accordingly  Orders Placed This Encounter  Procedures   TSH   T4, free   T3, free   Microalbumin /  creatinine urine ratio   Lipid Panel w/reflex Direct LDL   Component     Latest Ref Rng 09/01/2023  TSH     0.40 - 4.50 mIU/L 2.46   T4,Free(Direct)     0.8 - 1.8 ng/dL 1.1   Triiodothyronine,Free,Serum     2.3 - 4.2 pg/mL 2.4   Cholesterol     <200 mg/dL 578   HDL Cholesterol     > OR = 50 mg/dL 73   Triglycerides     <150 mg/dL 86   LDL Cholesterol (Calc)     mg/dL (calc) 77   Total CHOL/HDL Ratio     <5.0 (calc) 2.3   Non-HDL Cholesterol (Calc)     <130 mg/dL (calc) 94   ACR still pending, however, the rest of the labs are at goal. Will continue the same dose of methimazole for now.  Carlus Pavlov, MD PhD [facility]

## 2023-09-01 NOTE — Patient Instructions (Signed)
 Please continue: - Metformin ER-Sitagliptin 1000-50 mg 2x a daily - Jardiance 10 mg before breakfast   Also, continue: - Methimazole 5 mg daily  Please stop at the lab.  Please return for another visit in 4 months.

## 2023-09-02 ENCOUNTER — Encounter: Payer: Self-pay | Admitting: Internal Medicine

## 2023-09-02 LAB — LIPID PANEL W/REFLEX DIRECT LDL
Cholesterol: 167 mg/dL (ref <200)
HDL: 73 mg/dL (ref >=50)
LDL Cholesterol (Calc): 77 mg/dL
Non-HDL Cholesterol (Calc): 94 mg/dL (ref <130)
Total CHOL/HDL Ratio: 2.3 (calc) (ref <5.0)
Triglycerides: 86 mg/dL (ref <150)

## 2023-09-02 LAB — TSH: TSH: 2.46 m[IU]/L (ref 0.40–4.50)

## 2023-09-02 LAB — T4, FREE: Free T4: 1.1 ng/dL (ref 0.8–1.8)

## 2023-09-02 LAB — T3, FREE: T3, Free: 2.4 pg/mL (ref 2.3–4.2)

## 2023-09-16 ENCOUNTER — Other Ambulatory Visit

## 2023-09-17 LAB — MICROALBUMIN / CREATININE URINE RATIO
Creatinine, Urine: 41 mg/dL (ref 20–275)
Microalb Creat Ratio: 12 mg/g{creat} (ref <30)
Microalb, Ur: 0.5 mg/dL

## 2023-09-20 ENCOUNTER — Telehealth: Payer: Self-pay

## 2023-09-20 NOTE — Telephone Encounter (Signed)
 Pt needs PA for Janumet and Jardiance

## 2023-09-22 ENCOUNTER — Telehealth: Payer: Self-pay | Admitting: Pharmacy Technician

## 2023-09-22 ENCOUNTER — Other Ambulatory Visit (HOSPITAL_COMMUNITY): Payer: Self-pay

## 2023-09-22 NOTE — Telephone Encounter (Signed)
 Pharmacy Patient Advocate Encounter   Received notification from Pt Calls Messages that prior authorization for Janument 50-1000 mg is required/requested.   Insurance verification completed.   The patient is insured through [facility] MEDICAID .   Per test claim: Refill too soon. PA is not needed at this time. Medication was filled 09/01/2023. Next eligible fill date is 09/24/2023.

## 2023-09-22 NOTE — Telephone Encounter (Signed)
 PA request has been Received. New Encounter has been or will be created for follow up. For additional info see Pharmacy Prior Auth telephone encounter from 09/22/2023.

## 2023-09-22 NOTE — Telephone Encounter (Signed)
 Pharmacy Patient Advocate Encounter   Received notification from Pt Calls Messages that prior authorization for Jardiance 10 mg is required/requested.   Insurance verification completed.   The patient is insured through [facility] MEDICAID .   Per test claim: Refill too soon. PA is not needed at this time. Medication was filled 09/01/2023. Next eligible fill date is 04/19/20205.

## 2023-11-08 LAB — MICROALBUMIN / CREATININE URINE RATIO
Microalb Creat Ratio: 12
Microalb Creat Ratio: 13

## 2023-11-10 ENCOUNTER — Other Ambulatory Visit: Payer: Self-pay | Admitting: Internal Medicine

## 2023-11-28 ENCOUNTER — Encounter: Payer: Self-pay | Admitting: Podiatry

## 2023-11-28 ENCOUNTER — Ambulatory Visit: Admitting: Podiatry

## 2023-11-28 DIAGNOSIS — E559 Vitamin D deficiency, unspecified: Secondary | ICD-10-CM | POA: Insufficient documentation

## 2023-11-28 DIAGNOSIS — M79674 Pain in right toe(s): Secondary | ICD-10-CM

## 2023-11-28 DIAGNOSIS — R109 Unspecified abdominal pain: Secondary | ICD-10-CM | POA: Insufficient documentation

## 2023-11-28 DIAGNOSIS — B351 Tinea unguium: Secondary | ICD-10-CM | POA: Diagnosis not present

## 2023-11-28 DIAGNOSIS — D509 Iron deficiency anemia, unspecified: Secondary | ICD-10-CM | POA: Insufficient documentation

## 2023-11-28 DIAGNOSIS — K219 Gastro-esophageal reflux disease without esophagitis: Secondary | ICD-10-CM | POA: Insufficient documentation

## 2023-11-28 DIAGNOSIS — Z860100 Personal history of colon polyps, unspecified: Secondary | ICD-10-CM | POA: Insufficient documentation

## 2023-11-28 DIAGNOSIS — I700 Atherosclerosis of aorta: Secondary | ICD-10-CM | POA: Insufficient documentation

## 2023-11-28 DIAGNOSIS — H35039 Hypertensive retinopathy, unspecified eye: Secondary | ICD-10-CM | POA: Insufficient documentation

## 2023-11-28 DIAGNOSIS — K5730 Diverticulosis of large intestine without perforation or abscess without bleeding: Secondary | ICD-10-CM | POA: Insufficient documentation

## 2023-11-28 DIAGNOSIS — F988 Other specified behavioral and emotional disorders with onset usually occurring in childhood and adolescence: Secondary | ICD-10-CM | POA: Insufficient documentation

## 2023-11-28 DIAGNOSIS — E1142 Type 2 diabetes mellitus with diabetic polyneuropathy: Secondary | ICD-10-CM

## 2023-11-28 DIAGNOSIS — R194 Change in bowel habit: Secondary | ICD-10-CM | POA: Insufficient documentation

## 2023-11-28 DIAGNOSIS — N3281 Overactive bladder: Secondary | ICD-10-CM | POA: Insufficient documentation

## 2023-11-28 DIAGNOSIS — M79675 Pain in left toe(s): Secondary | ICD-10-CM | POA: Diagnosis not present

## 2023-11-28 NOTE — Progress Notes (Signed)
  Subjective:  Patient ID: Janet Kelley, female    DOB: [birth year],   MRN: [id number]  Chief Complaint  Patient presents with   Diabetes    Diabetic - last A1c was 6.6    63 y.o. female presents for concern of thickened elongated and painful nails that are difficult to trim. Requesting to have them trimmed today. Denies burning and tingling in their feet. Patient is diabetic and last A1c was  Lab Results  Component Value Date   HGBA1C 6.6 (A) 04/22/2023   .   PCP:  Redmon, Noelle, PA    . Denies any other pedal complaints. Denies n/v/f/c.   Past Medical History:  Diagnosis Date   ADHD (attention deficit hyperactivity disorder)    Anxiety    Cervical dysplasia    Diabetes mellitus    Type 2   Elevated cholesterol    Endometriosis    Fibroid    GERD (gastroesophageal reflux disease)    Hyperthyroidism    Tendonitis, Achilles    Vitamin D  deficiency 05/2013    Objective:  Physical Exam: Vascular: DP/PT pulses 2/4 bilateral. CFT <3 seconds. Absent hair growth on digits. Edema noted to bilateral lower extremities. Xerosis noted bilaterally.  Skin. No lacerations or abrasions bilateral feet. Nails 1-5 bilateral  are thickened discolored and elongated with subungual debris.  Musculoskeletal: MMT 5/5 bilateral lower extremities in DF, PF, Inversion and Eversion. Deceased ROM in DF of ankle joint.  Neurological: Sensation intact to light touch. Protective sensation diminished bilateral.    Assessment:   1. Pain due to onychomycosis of toenails of both feet   2. Type 2 diabetes mellitus with peripheral neuropathy (HCC)      Plan:  Patient was evaluated and treated and all questions answered. -Discussed and educated patient on diabetic foot care, especially with  regards to the vascular, neurological and musculoskeletal systems.  -Stressed the importance of good glycemic control and the detriment of not  controlling glucose levels in relation to the foot. -Discussed  supportive shoes at all times and checking feet regularly.  -Mechanically debrided all nails 1-5 bilateral using sterile nail nipper and filed with dremel without incident  -Answered all patient questions -Patient to return  in 3 months for at risk foot care -Patient advised to call the office if any problems or questions arise in the meantime.   Asberry Failing, DPM

## 2024-01-06 ENCOUNTER — Encounter: Payer: Self-pay | Admitting: Internal Medicine

## 2024-01-06 ENCOUNTER — Ambulatory Visit: Admitting: Internal Medicine

## 2024-01-06 VITALS — BP 120/60 | HR 97 | Ht 61.0 in | Wt 166.8 lb

## 2024-01-06 DIAGNOSIS — Z7984 Long term (current) use of oral hypoglycemic drugs: Secondary | ICD-10-CM | POA: Diagnosis not present

## 2024-01-06 DIAGNOSIS — E041 Nontoxic single thyroid nodule: Secondary | ICD-10-CM | POA: Diagnosis not present

## 2024-01-06 DIAGNOSIS — E0590 Thyrotoxicosis, unspecified without thyrotoxic crisis or storm: Secondary | ICD-10-CM | POA: Diagnosis not present

## 2024-01-06 DIAGNOSIS — E119 Type 2 diabetes mellitus without complications: Secondary | ICD-10-CM | POA: Diagnosis not present

## 2024-01-06 LAB — POCT GLYCOSYLATED HEMOGLOBIN (HGB A1C): Hemoglobin A1C: 6.5 % — AB (ref 4.0–5.6)

## 2024-01-06 LAB — T3, FREE: T3, Free: 2.8 pg/mL (ref 2.3–4.2)

## 2024-01-06 LAB — TSH: TSH: 2.96 m[IU]/L (ref 0.40–4.50)

## 2024-01-06 LAB — T4, FREE: Free T4: 1.2 ng/dL (ref 0.8–1.8)

## 2024-01-06 NOTE — Patient Instructions (Addendum)
 Please continue: - Metformin  ER-Sitagliptin 1000-50 mg 2x a daily - Jardiance  10 mg before breakfast   Also, continue: - Methimazole  at the same dose - let me know which tablets you have at home.  Please return for another visit in 4 months.

## 2024-01-06 NOTE — Progress Notes (Addendum)
 Patient ID: Janet Kelley, female   DOB: 18-Apr-1961, 63 y.o.   MRN: [id number]  HPI: Janet Kelley is a 63 y.o.-year-old female, returning for follow-up for DM2, dx in 2009, non-insulin-dependent, controlled, without long-term complications and also, thyroid  nodule and thyrotoxicosis. Pt. previously saw Dr. Kassie, but last visit with me 4 months ago.  Interim hx: No increased urination, blurry vision, chest pain. She had a weight gain of 20 pounds since last visit, per our scale.  DM2: Reviewed HbA1c: 09/01/2023: HbA1c 6.5% Lab Results  Component Value Date   HGBA1C 6.6 (A) 04/22/2023   HGBA1C 6.1 (A) 10/21/2022   HGBA1C 6.1 (A) 04/22/2022   HGBA1C 6.1 (H) 11/11/2021   HGBA1C 6.2 09/21/2021   HGBA1C 6.4 (A) 06/23/2021   HGBA1C 6.5 (A) 12/19/2020   HGBA1C 6.8 (A) 09/16/2020   HGBA1C 6.3 (A) 06/17/2020   HGBA1C 6.6 (A) 04/15/2020   HGBA1C 6.3 (A) 02/19/2020   HGBA1C 6.3 (A) 12/14/2019   HGBA1C 6.7 (A) 10/12/2019   HGBA1C 6.1 07/12/2019   Pt is on a regimen of: - Metformin  ER-Sitagliptin 1000-50 mg 2x a day - Jardiance  10 mg before breakfast >> at night >> am She previously tried Byetta, Victoza, Trulicity and they cause nausea. Jardiance  caused vaginitis in the past, now tolerated well.  Pt checks her sugars 0-1x a day and they are: - am: 110-120 >> 90-125 >> 73-130, 183 >> 90-120 >> 90-120 - 2h after b'fast: n/c - before lunch: n/c >> 70s-130 >> 70-130 - 2h after lunch: n/c - before dinner: n/c - 2h after dinner: n/c >> 91-123 >> n/c - bedtime: n/c >> up to 170 (snack) >> n/c - nighttime: n/c Lowest sugar was 90s >> 90 >> 73 >> 70s >> 70s; she has hypoglycemia awareness at 70.  Highest sugar was 200 >> 130 >> 183 >> 180 >> 200  Glucometer:Freestyle  - no CKD, last BUN/creatinine:  05/02/2023: 9/0.9, GFR 69, glucose 181 Lab Results  Component Value Date   BUN 10 03/04/2023   Lab Results  Component Value Date   CREATININE 0.90 03/04/2023   Lab Results   Component Value Date   MICRALBCREAT 12 09/16/2023  07/30/2022: ACR 69 07/28/2021: ACR 90.9 She is on Cozaar 25 mg.  -+ HL; last set of lipids:    Component Value Date/Time   CHOL 167 09/01/2023 1620   TRIG 86 09/01/2023 1620   HDL 73 09/01/2023 1620   CHOLHDL 2.3 09/01/2023 1620   LDLCALC 77 09/01/2023 1620  07/30/2022: 138/77/53/70 07/28/2021: 139/68/49/77 On Crestor 40 mg daily and Zetia 10 mg daily.  - last eye exam was in 2024. No DR reportedly.   - no numbness and tingling in her feet. Last foot exam 11/28/2023.  Thyroid  nodule:  Patient was diagnosed with a thyroid  nodule in 1995, and a biopsy returned benign in the same year, reportedly.  Pt denies: - feeling nodules in neck - hoarseness - dysphagia - choking  Thyrotoxicosis:  She was diagnosed with thyrotoxicosis in 2021.  She did not have imaging for this.  In 10/2022, she was on methimazole  10 mg daily.  She was off metoprolol . At that time, we decreased the methimazole  dose to 5 mg daily.  However, at today's visit she tells me she is taking half a tablet, but the tablets that we sent to the pharmacy in 04/2023 are now 5 mg.  She is not sure if she still has the 10 mg tablets at home so it is not  clear whether she is taking 2.5 or 5 mg daily...  Reviewed her TFTs: Lab Results  Component Value Date   TSH 2.46 09/01/2023   TSH 1.66 04/22/2023   TSH 2.72 12/30/2022   TSH 5.78 (H) 10/21/2022   TSH 1.56 04/22/2022   TSH 2.91 09/21/2021   TSH 3.12 06/23/2021   TSH 2.54 03/05/2021   TSH 1.99 12/19/2020   TSH 4.50 06/17/2020   TSH 0.57 04/15/2020   TSH 18.81 (H) 02/19/2020   TSH 8.64 (H) 12/14/2019   TSH <0.01 (L) 10/12/2019   TSH <0.01 (L) 08/10/2019   TSH <0.01 (L) 07/09/2019   TSH <0.010 (L) 06/12/2019   TSH 1.059 05/09/2015   FREET4 1.1 09/01/2023   T3FREE 2.4 09/01/2023   T3FREE 2.3 04/22/2023   T3FREE 3.1 12/30/2022   T3FREE 2.6 10/21/2022   T3FREE 2.8 04/22/2022   No FH of thyroid  cancer.  No h/o radiation tx to head or neck. No Biotin use. No recent steroids use.   She has a history of vitamin D  deficiency - on vitamin D3 2000 units, fibroids, endometriosis. She had nausea/vomiting, diarrhea, AP - this the second episode after 02/2023 (went to the ED). CT abd was normal at that time. She was given Phenergan.  Her lipase was slightly elevated: 03/04/2023: Lipase 83  ROS: + see HPI  Past Medical History:  Diagnosis Date   ADHD (attention deficit hyperactivity disorder)    Anxiety    Cervical dysplasia    Diabetes mellitus    Type 2   Elevated cholesterol    Endometriosis    Fibroid    GERD (gastroesophageal reflux disease)    Hyperthyroidism    Tendonitis, Achilles    Vitamin D  deficiency 05/2013   Past Surgical History:  Procedure Laterality Date   CERVICAL DISC SURGERY     CHOLECYSTECTOMY N/A 11/23/2021   Procedure: LAPAROSCOPIC CHOLECYSTECTOMY WITH INTRAOPERATIVE CHOLANGIOGRAM;  Surgeon: Eletha Boas, MD;  Location: [facility];  Service: General;  Laterality: N/A;   COLPOSCOPY     GYNECOLOGIC CRYOSURGERY     KNEE SURGERY Left    Arthroscopic   PELVIC LAPAROSCOPY     DL laser endometriosis   VAGINAL HYSTERECTOMY  06/08/2007   LAVH   Social History   Socioeconomic History   Marital status: Single    Spouse name: Not on file   Number of children: Not on file   Years of education: Not on file   Highest education level: Not on file  Occupational History   Not on file  Tobacco Use   Smoking status: Former    Current packs/day: 0.00    Types: Cigarettes    Quit date: 03/16/2006    Years since quitting: 17.8   Smokeless tobacco: Never  Vaping Use   Vaping status: Never Used  Substance and Sexual Activity   Alcohol use: Not Currently    Comment: occasiaonal   Drug use: No   Sexual activity: Not Currently    Partners: Male    Birth control/protection: Surgical    Comment: 1st intercourse- 18, partners- greater than 5  Other Topics Concern   Not on  file  Social History Narrative   Not on file   Social Drivers of Health   Financial Resource Strain: Not on file  Food Insecurity: Not on file  Transportation Needs: Not on file  Physical Activity: Not on file  Stress: Not on file  Social Connections: Not on file  Intimate Partner Violence: Not on file  Current Outpatient Medications on File Prior to Visit  Medication Sig Dispense Refill   acetaminophen  (TYLENOL ) 500 MG tablet Take 1,000 mg by mouth every 6 (six) hours as needed (pain).     ADDERALL XR 10 MG 24 hr capsule 1 capsule in the AM and one at 12pm Orally     busPIRone (BUSPAR) 10 MG tablet Take 20 mg by mouth 2 (two) times daily.     Cholecalciferol (VITAMIN D3 PO) Take 1 tablet by mouth daily.     citalopram (CELEXA) 40 MG tablet Take 40 mg by mouth every evening.     clonazePAM (KLONOPIN) 0.5 MG tablet Take 0.5 mg by mouth in the morning and at bedtime.     empagliflozin  (JARDIANCE ) 10 MG TABS tablet Take 1 tablet (10 mg total) by mouth daily. 90 tablet 3   estradiol  (ESTRACE ) 0.1 MG/GM vaginal cream Place 1 Applicatorful vaginally 2 (two) times a week. 42.5 g 4   ezetimibe (ZETIA) 10 MG tablet Take 10 mg by mouth at bedtime.      fexofenadine (ALLEGRA) 180 MG tablet Take 180 mg by mouth in the morning.     fluconazole  (DIFLUCAN ) 150 MG tablet TAKE 1 TABLET(150 MG) BY MOUTH 1 TIME FOR 1 DOSE 1 tablet 1   JANUMET  50-1000 MG tablet Take 1 tablet by mouth 2 (two) times daily. 180 tablet 3   loperamide (IMODIUM) 2 MG capsule Take 2-4 mg by mouth 4 (four) times daily as needed for diarrhea or loose stools.     losartan (COZAAR) 25 MG tablet Take 25 mg by mouth in the morning.     methimazole  (TAPAZOLE ) 5 MG tablet Take 1 tablet (5 mg total) by mouth daily. 90 tablet 3   Multiple Vitamin (MULTIVITAMIN WITH MINERALS) TABS tablet Take 1 tablet by mouth daily.     omeprazole  (PRILOSEC) 40 MG capsule Take 40 mg by mouth in the morning and at bedtime.     potassium chloride  (KLOR-CON) 10 MEQ tablet Take 10 mEq by mouth every evening.     rosuvastatin (CRESTOR) 40 MG tablet Take 40 mg by mouth at bedtime.      tiZANidine  (ZANAFLEX ) 4 MG tablet Take 1 tablet (4 mg total) by mouth every 6 (six) hours as needed for muscle spasms. Do not take while driving or operating heavy machinery. 30 tablet 0   traMADol  (ULTRAM ) 50 MG tablet Take 1-2 tablets (50-100 mg total) by mouth every 6 (six) hours as needed for moderate pain. 15 tablet 0   No current facility-administered medications on file prior to visit.   Allergies  Allergen Reactions   Dapagliflozin     Other Reaction(s): yeast infection   Diclofenac      Other Reaction(s): Stomach upset   Liraglutide     Other Reaction(s): GI   Lisinopril     Other Reaction(s): did not tolerate/ CP   Nsaids Nausea Only   Ceclor [Cefaclor] Swelling and Rash   Family History  Problem Relation Age of Onset   Hypertension Mother    Heart disease Mother    Diabetes Mother    Cancer Mother        throat cancer   Hyperthyroidism Father    Hypertension Sister    Heart disease Paternal Uncle    Colon cancer Paternal Grandmother    Colon cancer Cousin        STOMACH   PE: BP 120/60   Pulse 97   Ht 5' 1 (1.549 m)  Wt 166 lb 12.8 oz (75.7 kg)   SpO2 97%   BMI 31.52 kg/m  Wt Readings from Last 3 Encounters:  01/06/24 166 lb 12.8 oz (75.7 kg)  09/01/23 146 lb 12.8 oz (66.6 kg)  04/22/23 139 lb (63 kg)    Constitutional: normal weight, in NAD Eyes:  EOMI, no exophthalmos ENT: no neck masses, no cervical lymphadenopathy Cardiovascular: tachycardia, RR, No MRG Respiratory: CTA B Musculoskeletal: no deformities Skin:no rashes Neurological: no tremor with outstretched hands  ASSESSMENT: 1. DM2, non-insulin-dependent, uncontrolled, without long-term complications  2. Thyroid  nodule  3. Thyrotoxicosis  PLAN:  1. Patient with longstanding, uncontrolled, type 2 diabetes, on oral antidiabetic regimen with metformin ,  DPP4 inhibitor, and SGLT2 inhibitor, with an HbA1c of 6.5% at last visit, decreased from 6.6% previously.  At last visit, sugars were mostly at goal so we did not change her regimen.  I refilled her medications. -At today's visit, sugars remain at goal.  Will not change her regimen for now. - I suggested to:  Patient Instructions  Please continue: - Metformin  ER-Sitagliptin 1000-50 mg 2x a daily - Jardiance  10 mg before breakfast   Also, continue: - Methimazole  at the same dose - let me know which tablets you have at home.  Please return for another visit in 4 months.  - we checked her HbA1c: 6.5% (stable, at goal) - advised to check sugars at different times of the day - 1x a day, rotating check times - advised for yearly eye exams >> she is UTD - return to clinic in 4 months  2.  Thyroid  nodule -Diagnosed in 1995, status post benign biopsy reportedly in 1995 On the CT angiography from 2021, the thyroid  appears to be unremarkable - No neck compression symptoms and she does not have masses felt on palpation of her neck today - Will continue to follow her clinically for now  3.  Thyrotoxicosis - Diagnosed in 2021 -Controlled on methimazole , with a dose decreased from 10 mg daily to 5 mg daily after a TSH returned elevated, at 5.78 in 10/2022.  In 04/2023, I sent a prescription for the 5 mg tablets to the pharmacy but she mentions that she is still cutting the tablet in half.  It is unclear which tablets she has at home.  I advised her to look at home and let me know - TSH was normal at last visit: Lab Results  Component Value Date   TSH 2.46 09/01/2023  -She tolerates methimazole  well, without side effects - Will recheck her TFTs today  Component     Latest Ref Rng 01/06/2024  TSH     0.40 - 4.50 mIU/L 2.96   T4,Free(Direct)     0.8 - 1.8 ng/dL 1.2   Hemoglobin J8R     4.0 - 5.6 % 6.5 !   Triiodothyronine,Free,Serum     2.3 - 4.2 pg/mL 2.8   Normal TFTs.  Lela Fendt,  MD PhD [facility] Endocrinology

## 2024-01-09 ENCOUNTER — Ambulatory Visit: Payer: Self-pay | Admitting: Internal Medicine

## 2024-01-11 ENCOUNTER — Encounter: Payer: Self-pay | Admitting: Obstetrics and Gynecology

## 2024-01-11 ENCOUNTER — Ambulatory Visit (INDEPENDENT_AMBULATORY_CARE_PROVIDER_SITE_OTHER): Admitting: Obstetrics and Gynecology

## 2024-01-11 VITALS — BP 108/62 | HR 92 | Temp 98.2°F | Ht 61.5 in | Wt 161.0 lb

## 2024-01-11 DIAGNOSIS — Z01419 Encounter for gynecological examination (general) (routine) without abnormal findings: Secondary | ICD-10-CM | POA: Diagnosis not present

## 2024-01-11 DIAGNOSIS — N958 Other specified menopausal and perimenopausal disorders: Secondary | ICD-10-CM | POA: Diagnosis not present

## 2024-01-11 DIAGNOSIS — Z1331 Encounter for screening for depression: Secondary | ICD-10-CM | POA: Diagnosis not present

## 2024-01-11 MED ORDER — IMVEXXY MAINTENANCE PACK 10 MCG VA INST
VAGINAL_INSERT | VAGINAL | 3 refills | Status: AC
Start: 2024-01-11 — End: ?

## 2024-01-11 NOTE — Progress Notes (Signed)
 63 y.o. G0P0000 female s/p TVH, vitamin D  deficiency, GSM (vaginal estrogen), stress urinary incontinence, fecal smearing (referred to PFPT 2024) here for annual exam. Single.  Had an episode of vaginal bleeding 11/30/2022 started on vaginal estrogen. She reports vaginal cream lingers too long after use. She is still wiping away cream two days after application. Would like to discuss alternative. Hekps with libido and has used the pill before. History of fecal smearing and SUI.  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 in [facility]  PHQ-2 Total Score 3   PHQ9: 7   GYN HISTORY: No sig hx  OB History  Gravida Para Term Preterm AB Living  0 0 0 0 0 0  SAB IAB Ectopic Multiple Live Births  0 0 0 0 0   Past Medical History:  Diagnosis Date   ADHD (attention deficit hyperactivity disorder)    Anxiety    Cervical dysplasia    Diabetes mellitus    Type 2   Elevated cholesterol    Endometriosis    Fibroid    GERD (gastroesophageal reflux disease)    Hyperthyroidism    Tendonitis, Achilles    Vitamin D  deficiency 05/2013   Past Surgical History:  Procedure Laterality Date   CERVICAL DISC SURGERY     CHOLECYSTECTOMY N/A 11/23/2021   Procedure: LAPAROSCOPIC CHOLECYSTECTOMY WITH INTRAOPERATIVE CHOLANGIOGRAM;  Surgeon: Eletha Boas, MD;  Location: [facility];  Service: General;  Laterality: N/A;   COLPOSCOPY     GYNECOLOGIC CRYOSURGERY     KNEE SURGERY Left    Arthroscopic   PELVIC LAPAROSCOPY     DL laser endometriosis   VAGINAL HYSTERECTOMY  06/08/2007   LAVH   Current  Outpatient Medications on File Prior to Visit  Medication Sig Dispense Refill   acetaminophen  (TYLENOL ) 500 MG tablet Take 1,000 mg by mouth every 6 (six) hours as needed (pain).     busPIRone (BUSPAR) 15 MG tablet Take by mouth.     Cholecalciferol (VITAMIN D3 PO) Take 1 tablet by mouth daily.     citalopram (CELEXA) 40 MG tablet Take 40 mg by mouth every evening.     clonazePAM (KLONOPIN) 0.5 MG tablet Take 0.5 mg by mouth in the morning and at bedtime.     empagliflozin  (JARDIANCE ) 10 MG TABS tablet Take 1 tablet (10 mg total) by mouth daily. 90 tablet 3   ezetimibe (ZETIA) 10 MG tablet Take 10 mg by mouth at bedtime.      fexofenadine (ALLEGRA) 180 MG tablet Take 180 mg by mouth in the morning.     fluconazole  (DIFLUCAN ) 150 MG tablet TAKE 1 TABLET(150 MG) BY MOUTH 1 TIME FOR 1 DOSE 1 tablet 1   JANUMET  50-1000 MG tablet Take 1 tablet by mouth 2 (two) times daily. 180 tablet 3   loperamide (IMODIUM) 2 MG capsule Take 2-4 mg by mouth 4 (four) times daily as needed for diarrhea or loose stools.     losartan (COZAAR) 25 MG tablet Take 25 mg by mouth in the morning.     methimazole  (TAPAZOLE ) 5 MG tablet Take 1 tablet (5 mg total)  by mouth daily. (Patient taking differently: Take 2.5 mg by mouth daily.) 90 tablet 3   Multiple Vitamin (MULTIVITAMIN WITH MINERALS) TABS tablet Take 1 tablet by mouth daily.     omeprazole  (PRILOSEC) 40 MG capsule Take 40 mg by mouth in the morning and at bedtime.     potassium chloride (KLOR-CON) 10 MEQ tablet Take 10 mEq by mouth every evening.     rosuvastatin (CRESTOR) 40 MG tablet Take 40 mg by mouth at bedtime.      No current facility-administered medications on file prior to visit.   Social History   Socioeconomic History   Marital status: Single    Spouse name: Not on file   Number of children: Not on file   Years of education: Not on file   Highest education level: Not on file  Occupational History   Not on file  Tobacco Use   Smoking status:  Former    Current packs/day: 0.00    Types: Cigarettes    Quit date: 03/16/2006    Years since quitting: 17.8   Smokeless tobacco: Never  Vaping Use   Vaping status: Never Used  Substance and Sexual Activity   Alcohol use: Yes    Comment: occasiaonal   Drug use: No   Sexual activity: Not Currently    Partners: Male    Birth control/protection: Surgical    Comment: 1st intercourse- 18, partners- greater than 5  Other Topics Concern   Not on file  Social History Narrative   Not on file   Social Drivers of Health   Financial Resource Strain: Not on file  Food Insecurity: Not on file  Transportation Needs: Not on file  Physical Activity: Not on file  Stress: Not on file  Social Connections: Not on file  Intimate Partner Violence: Not on file   Family History  Problem Relation Age of Onset   Hypertension Mother    Heart disease Mother    Diabetes Mother    Cancer Mother        throat cancer   Hyperthyroidism Father    Hypertension Sister    Heart disease Paternal Uncle    Colon cancer Paternal Grandmother    Colon cancer Cousin        STOMACH   Allergies  Allergen Reactions   Dapagliflozin     Other Reaction(s): yeast infection   Diclofenac      Other Reaction(s): Stomach upset   Liraglutide     Other Reaction(s): GI   Lisinopril     Other Reaction(s): did not tolerate/ CP   Nsaids Nausea Only   Ceclor [Cefaclor] Swelling and Rash     PE Today's Vitals   01/11/24 0813  BP: 108/62  Pulse: 92  Temp: 98.2 F (36.8 C)  TempSrc: Oral  SpO2: 97%  Weight: 161 lb (73 kg)  Height: 5' 1.5 (1.562 m)   Body mass index is 29.93 kg/m.  Physical Exam Vitals reviewed. Exam conducted with a chaperone present.  Constitutional:      General: She is not in acute distress.    Appearance: Normal appearance.  HENT:     Head: Normocephalic and atraumatic.     Nose: Nose normal.  Eyes:     Extraocular Movements: Extraocular movements intact.      Conjunctiva/sclera: Conjunctivae normal.  Neck:     Thyroid : No thyroid  mass, thyromegaly or thyroid  tenderness.  Pulmonary:     Effort: Pulmonary effort is normal.  Chest:     Chest  wall: No mass or tenderness.  Breasts:    Right: Normal. No swelling, mass, nipple discharge or tenderness.     Left: Normal. No swelling, mass, nipple discharge or tenderness.  Abdominal:     General: There is no distension.     Palpations: Abdomen is soft.     Tenderness: There is no abdominal tenderness.  Genitourinary:    General: Normal vulva.     Exam position: Lithotomy position.     Urethra: No prolapse.     Vagina: Normal. No vaginal discharge or bleeding.     Cervix: No lesion.     Adnexa: Right adnexa normal and left adnexa normal.     Comments: Cervix and uterus absent Musculoskeletal:        General: Normal range of motion.     Cervical back: Normal range of motion.  Lymphadenopathy:     Upper Body:     Right upper body: No axillary adenopathy.     Left upper body: No axillary adenopathy.     Lower Body: No right inguinal adenopathy. No left inguinal adenopathy.  Skin:    General: Skin is warm and dry.  Neurological:     General: No focal deficit present.     Mental Status: She is alert.  Psychiatric:        Mood and Affect: Mood normal.        Behavior: Behavior normal.       Assessment and Plan:        Encounter for breast and pelvic examination Assessment & Plan: Cervical cancer screening no longer indicated. Encouraged annual mammogram screening Colonoscopy UTD DXA UTD Labs and immunizations with her primary Encouraged safe sexual practices as indicated Encouraged healthy lifestyle practices with diet and exercise For patients under 50-70yo, I recommend 1200mg  calcium daily and 600IU of vitamin D  daily.    Genitourinary syndrome of menopause -     Imvexxy  Maintenance Pack; Insert 1 tablet nightly into the vagina twice a week.  Dispense: 24 each; Refill:  3   Vera LULLA Pa, MD

## 2024-01-11 NOTE — Patient Instructions (Signed)

## 2024-01-11 NOTE — Assessment & Plan Note (Signed)
 Cervical cancer screening no longer indicated. Encouraged annual mammogram screening Colonoscopy UTD DXA UTD Labs and immunizations with her primary Encouraged safe sexual practices as indicated Encouraged healthy lifestyle practices with diet and exercise For patients under 50-62yo, I recommend 1200mg  calcium daily and 600IU of vitamin D  daily.

## 2024-01-16 ENCOUNTER — Telehealth: Payer: Self-pay

## 2024-01-16 ENCOUNTER — Other Ambulatory Visit (HOSPITAL_COMMUNITY): Payer: Self-pay

## 2024-01-16 NOTE — Telephone Encounter (Signed)
*  Endo  Pharmacy Patient Advocate Encounter   Received notification from CoverMyMeds that prior authorization for Jardiance  10MG  tablets  is required/requested.   Insurance verification completed.   The patient is insured through [facility] .   Per test claim: PA required; PA submitted to above mentioned insurance via Latent Key/confirmation #/EOC [employer] Status is pending

## 2024-01-16 NOTE — Telephone Encounter (Signed)
 Pharmacy Patient Advocate Encounter  Received notification from [employer] that Prior Authorization for Jardiance  has been APPROVED from 01/16/2024 to 01/15/2025. Ran test claim, Copay is $4.00. This test claim was processed through [facility]- copay amounts may vary at other pharmacies due to pharmacy/plan contracts, or as the patient moves through the different stages of their insurance plan.

## 2024-01-16 NOTE — Telephone Encounter (Signed)
 Pt called in stating that the [employer]adiol  is requiring a PA, I advised that pt that we haven't received a PA request but once we do we will start the PA process.   This can take 48-72 hours to be processed with insurance and we will communicate the outcome one we get one.

## 2024-01-24 ENCOUNTER — Telehealth: Payer: Self-pay

## 2024-01-24 NOTE — Telephone Encounter (Signed)
 Prior Authorization for Imvexxy  vaginal inserts has been submitted. Please allow up to 48 hours for a response.   Key: [id number] Your information has been sent to [employer].  Pt is aware.

## 2024-01-30 ENCOUNTER — Other Ambulatory Visit: Payer: Self-pay | Admitting: Internal Medicine

## 2024-01-31 NOTE — Telephone Encounter (Signed)
 This request has received an Unfavorable outcome - DENIED  Request Reference Number: [id number]. IMVEXXY  MAIN SUP is denied for not meeting the prior authorization requirement(s). For further questions, call Community [employer] State at [phone] for more information. An eAppeal may be available.  Patient is aware and wants to know if there is an alternative recommendation. Please advise.

## 2024-03-05 ENCOUNTER — Ambulatory Visit: Admitting: Podiatry

## 2024-05-10 NOTE — Progress Notes (Unsigned)
 Patient ID: Janet Kelley, female   DOB: 17-Oct-1960, 63 y.o.   MRN: [id number]  HPI: Janet Kelley is a 63 y.o.-year-old female, returning for follow-up for DM2, dx in 2009, non-insulin-dependent, controlled, without long-term complications and also, thyroid  nodule and thyrotoxicosis. Pt. previously saw Dr. Kassie, but last visit with me 4 months ago.  Interim hx: No increased urination, blurry vision, chest pain. She had a weight gain of 20 pounds before last visit, per our scale.  DM2: Reviewed HbA1c: Lab Results  Component Value Date   HGBA1C 6.5 (A) 01/06/2024   HGBA1C 6.6 (A) 04/22/2023   HGBA1C 6.1 (A) 10/21/2022   HGBA1C 6.1 (A) 04/22/2022   HGBA1C 6.1 (H) 11/11/2021   HGBA1C 6.2 09/21/2021   HGBA1C 6.4 (A) 06/23/2021   HGBA1C 6.5 (A) 12/19/2020   HGBA1C 6.8 (A) 09/16/2020   HGBA1C 6.3 (A) 06/17/2020   HGBA1C 6.6 (A) 04/15/2020   HGBA1C 6.3 (A) 02/19/2020   HGBA1C 6.3 (A) 12/14/2019   HGBA1C 6.7 (A) 10/12/2019   HGBA1C 6.1 07/12/2019  09/01/2023: HbA1c 6.5%  Pt is on a regimen of: - Metformin  ER-Sitagliptin  (JanuMet ) 1000-50 mg 2x a day - Jardiance  10 mg before breakfast >> at night >> am She previously tried Byetta, Victoza, Trulicity and they cause nausea. Jardiance  caused vaginitis in the past, now tolerated well.  Pt was checking her sugars 0-1x a day >> not checking now. From last OV: - am:  73-130, 183 >> 90-120 >> 90-120 - 2h after b'fast: n/c - before lunch: n/c >> 70s-130 >> 70-130 - 2h after lunch: n/c - before dinner: n/c - 2h after dinner: n/c >> 91-123 >> n/c - bedtime: n/c >> up to 170 (snack) >> n/c - nighttime: n/c Lowest sugar was 73 >> 70s >> 70s >> ?; she has hypoglycemia awareness at 70.  Highest sugar was 183 >> 180 >> 200 >> ?  Glucometer:Freestyle  - no CKD, last BUN/creatinine:  05/02/2023: 9/0.9, GFR 69, glucose 181 Lab Results  Component Value Date   BUN 10 03/04/2023   Lab Results  Component Value Date   CREATININE  0.90 03/04/2023    Lab Results  Component Value Date   MICRALBCREAT 12 09/16/2023  07/30/2022: ACR 69 07/28/2021: ACR 90.9 She is on Cozaar 25 mg.  -+ HL; last set of lipids:     Component Value Date/Time   CHOL 167 09/01/2023 1620   TRIG 86 09/01/2023 1620   HDL 73 09/01/2023 1620   CHOLHDL 2.3 09/01/2023 1620   LDLCALC 77 09/01/2023 1620  07/30/2022: 138/77/53/70 07/28/2021: 139/68/49/77 On Crestor 40 mg daily and Zetia 10 mg daily.  - last eye exam was in 2024. No DR reportedly. He   - no numbness and tingling in her feet. Last foot exam 11/28/2023.  Thyroid  nodule:  Patient was diagnosed with a thyroid  nodule in 1995, and a biopsy returned benign in the same year, reportedly.  Pt denies: - feeling nodules in neck - hoarseness - dysphagia - choking  Thyrotoxicosis:  She was diagnosed with thyrotoxicosis in 2021.  She did not have imaging for this.  In 10/2022, she was on methimazole  10 mg daily.  She was off metoprolol . At that time, we decreased the methimazole  dose to 5 mg daily.  However, at today's visit she tells me she is taking half a tablet, but the tablets that we sent to the pharmacy in 04/2023 are now 5 mg.   Reviewed her TFTs: Lab Results  Component Value  Date   TSH 2.96 01/06/2024   TSH 2.46 09/01/2023   TSH 1.66 04/22/2023   TSH 2.72 12/30/2022   TSH 5.78 (H) 10/21/2022   TSH 1.56 04/22/2022   TSH 2.91 09/21/2021   TSH 3.12 06/23/2021   TSH 2.54 03/05/2021   TSH 1.99 12/19/2020   TSH 4.50 06/17/2020   TSH 0.57 04/15/2020   TSH 18.81 (H) 02/19/2020   TSH 8.64 (H) 12/14/2019   TSH <0.01 (L) 10/12/2019   TSH <0.01 (L) 08/10/2019   TSH <0.01 (L) 07/09/2019   TSH <0.010 (L) 06/12/2019   TSH 1.059 05/09/2015   FREET4 1.2 01/06/2024   T3FREE 2.8 01/06/2024   T3FREE 2.4 09/01/2023   T3FREE 2.3 04/22/2023   T3FREE 3.1 12/30/2022   T3FREE 2.6 10/21/2022   T3FREE 2.8 04/22/2022   No FH of thyroid  cancer. No h/o radiation tx to head or  neck. No Biotin use. No recent steroids use.   She has a history of vitamin D  deficiency - on vitamin D3 2000 units, fibroids, endometriosis. She had nausea/vomiting, diarrhea, AP - this the second episode after 02/2023 (went to the ED). CT abd was normal at that time. She was given Phenergan.  Her lipase was slightly elevated: 9 05/04/2023: Lipase 83  ROS: + see HPI  Past Medical History:  Diagnosis Date   ADHD (attention deficit hyperactivity disorder)    Anxiety    Cervical dysplasia    Diabetes mellitus    Type 2   Elevated cholesterol    Endometriosis    Fibroid    GERD (gastroesophageal reflux disease)    Hyperthyroidism    Tendonitis, Achilles    Vitamin D  deficiency 05/2013   Past Surgical History:  Procedure Laterality Date   CERVICAL DISC SURGERY     CHOLECYSTECTOMY N/A 11/23/2021   Procedure: LAPAROSCOPIC CHOLECYSTECTOMY WITH INTRAOPERATIVE CHOLANGIOGRAM;  Surgeon: Eletha Boas, MD;  Location: [facility];  Service: General;  Laterality: N/A;   COLPOSCOPY     GYNECOLOGIC CRYOSURGERY     KNEE SURGERY Left    Arthroscopic   PELVIC LAPAROSCOPY     DL laser endometriosis   VAGINAL HYSTERECTOMY  06/08/2007   [facility]   Social History   Socioeconomic History   Marital status: Single    Spouse name: Not on file   Number of children: Not on file   Years of education: Not on file   Highest education level: Not on file  Occupational History   Not on file  Tobacco Use   Smoking status: Former    Current packs/day: 0.00    Types: Cigarettes    Quit date: 03/16/2006    Years since quitting: 18.1   Smokeless tobacco: Never  Vaping Use   Vaping status: Never Used  Substance and Sexual Activity   Alcohol use: Yes    Comment: occasiaonal   Drug use: No   Sexual activity: Not Currently    Partners: Male    Birth control/protection: Surgical    Comment: 1st intercourse- 18, partners- greater than 5  Other Topics Concern   Not on file  Social History Narrative   Not  on file   Social Drivers of Health   Financial Resource Strain: Not on file  Food Insecurity: Not on file  Transportation Needs: Not on file  Physical Activity: Not on file  Stress: Not on file  Social Connections: Not on file  Intimate Partner Violence: Not on file   Current Outpatient Medications on File Prior to Visit  Medication Sig Dispense Refill   acetaminophen  (TYLENOL ) 500 MG tablet Take 1,000 mg by mouth every 6 (six) hours as needed (pain).     busPIRone (BUSPAR) 15 MG tablet Take by mouth.     Cholecalciferol (VITAMIN D3 PO) Take 1 tablet by mouth daily.     citalopram (CELEXA) 40 MG tablet Take 40 mg by mouth every evening.     clonazePAM (KLONOPIN) 0.5 MG tablet Take 0.5 mg by mouth in the morning and at bedtime.     empagliflozin  (JARDIANCE ) 10 MG TABS tablet Take 1 tablet (10 mg total) by mouth daily. 90 tablet 3   Estradiol  (IMVEXXY  MAINTENANCE PACK) 10 MCG INST Insert 1 tablet nightly into the vagina twice a week. 24 each 3   ezetimibe (ZETIA) 10 MG tablet Take 10 mg by mouth at bedtime.      fexofenadine (ALLEGRA) 180 MG tablet Take 180 mg by mouth in the morning.     fluconazole  (DIFLUCAN ) 150 MG tablet TAKE 1 TABLET(150 MG) BY MOUTH 1 TIME FOR 1 DOSE 1 tablet 1   JANUMET  50-1000 MG tablet Take 1 tablet by mouth 2 (two) times daily. 180 tablet 3   loperamide (IMODIUM) 2 MG capsule Take 2-4 mg by mouth 4 (four) times daily as needed for diarrhea or loose stools.     losartan (COZAAR) 25 MG tablet Take 25 mg by mouth in the morning.     methimazole  (TAPAZOLE ) 5 MG tablet Take 1 tablet (5 mg total) by mouth daily. 30 tablet 3   Multiple Vitamin (MULTIVITAMIN WITH MINERALS) TABS tablet Take 1 tablet by mouth daily.     omeprazole  (PRILOSEC) 40 MG capsule Take 40 mg by mouth in the morning and at bedtime.     potassium chloride (KLOR-CON) 10 MEQ tablet Take 10 mEq by mouth every evening.     rosuvastatin (CRESTOR) 40 MG tablet Take 40 mg by mouth at bedtime.      No  current facility-administered medications on file prior to visit.   Allergies  Allergen Reactions   Dapagliflozin     Other Reaction(s): yeast infection   Diclofenac      Other Reaction(s): Stomach upset   Liraglutide     Other Reaction(s): GI   Lisinopril     Other Reaction(s): did not tolerate/ CP   Nsaids Nausea Only   Ceclor [Cefaclor] Swelling and Rash   Family History  Problem Relation Age of Onset   Hypertension Mother    Heart disease Mother    Diabetes Mother    Cancer Mother        throat cancer   Hyperthyroidism Father    Hypertension Sister    Heart disease Paternal Uncle    Colon cancer Paternal Grandmother    Colon cancer Cousin        STOMACH   PE: BP 118/60   Pulse 100   Ht 5' 1.5 (1.562 m)   Wt 163 lb 9.6 oz (74.2 kg)   SpO2 99%   BMI 30.41 kg/m  Wt Readings from Last 3 Encounters:  05/11/24 163 lb 9.6 oz (74.2 kg)  01/11/24 161 lb (73 kg)  01/06/24 166 lb 12.8 oz (75.7 kg)    Constitutional: normal weight, in NAD Eyes:  EOMI, no exophthalmos ENT: no neck masses, no cervical lymphadenopathy Cardiovascular: tachycardia, RR, No MRG Respiratory: CTA B Musculoskeletal: no deformities Skin:no rashes Neurological: no tremor with outstretched hands  ASSESSMENT: 1. DM2, non-insulin-dependent, fairly well controlled, without long-term complications  2.  Thyroid  nodule  3. Thyrotoxicosis  PLAN:  1. Patient with longstanding, fairly well controlled, type 2 diabetes, on oral antidiabetic regimen with metformin , DPP 4 inhibitor and SGLT2 inhibitor.  At last visit, HbA1c was 6.5%, stable.  Sugars were at goal so we did not have to change her regimen at that time. - Recently, she is not checking blood sugars at home.  She does have the supplies at home so I strongly advised her to restart them.  HbA1c appears to be higher than before (see below), but still at goal.  I did not feel another intervention is needed other than restarting to check the blood  sugars. - I suggested to:  Patient Instructions  Please continue: - Metformin  ER-Sitagliptin 1000-50 mg 2x a daily - Jardiance  10 mg before breakfast   Start checking sugars 1x day.  Also, continue: - Methimazole  5 mg daily  Check if [facility] takes [employer] stationers - [phone].  Please return for another visit in 4-6 months.  - we checked her HbA1c: 6.7% (slightly higher) - advised to check sugars at different times of the day - 1x a day, rotating check times - advised for yearly eye exams >> she is due - will check a CMP at today's visit as this is due for her - return to clinic in 4-6 months  2.  Thyroid  nodule -Diagnosed in 1995, status post benign biopsy reportedly in 1995 On the CT angiography from 2021, the thyroid  appears to be unremarkable - No neck compression symptoms or masses felt on palpation of her neck today - We will continue to follow her clinically for now  3.  Thyrotoxicosis - Diagnosed in 2021 -Controlled on methimazole , with a dose decreased from 10 mg daily to 5 mg daily after a TSH returned elevated, at 5.78 in 10/2022.  In 04/2023, I sent a prescription for the 5 mg tablets to the pharmacy.  At last visit she mentioned that she was still cutting it in half.  It was unclear which tablet she had at home.  I advised her to look it up and let me know.  At today's visit she tells me that she is actually taking 5 mg daily. - TSH was normal at last visit: Lab Results  Component Value Date   TSH 2.96 01/06/2024  - She tolerates methimazole  well, without side effects - At today's visit she is tachycardic, but there are no tremors, palpitations, or weight loss. - Will recheck her TFTs and change the methimazole  dose accordingly.  Component     Latest Ref Rng 05/11/2024  Sodium     135 - 146 mmol/L 141   Potassium     3.5 - 5.3 mmol/L 3.6   Chloride     98 - 110 mmol/L 102   CO2     20 - 32 mmol/L 29   Glucose     65 - 99 mg/dL 899 (H)    BUN     7 - 25 mg/dL 6 (L)   Creatinine     0.50 - 1.05 mg/dL 9.21   Calcium     8.6 - 10.4 mg/dL 89.9   Total Protein     6.1 - 8.1 g/dL 7.3   AST     10 - 35 U/L 17   ALT     6 - 29 U/L 10   Total Bilirubin     0.2 - 1.2 mg/dL 0.7   TSH     9.59 -  4.50 mIU/L 2.09   T4,Free(Direct)     0.8 - 1.8 ng/dL 1.2   Hemoglobin J8R     4.0 - 5.6 % 6.7 !   Triiodothyronine,Free,Serum     2.3 - 4.2 pg/mL 2.7   eGFR     > OR = 60 mL/min/1.66m2 86   BUN/Creatinine Ratio     6 - 22 (calc) 8   Albumin MSPROF     3.6 - 5.1 g/dL 4.6   Globulin     1.9 - 3.7 g/dL (calc) 2.7   AG Ratio     1.0 - 2.5 (calc) 1.7   Alkaline phosphatase (APISO)     37 - 153 U/L 57   Labs are all at goal.  We can continue the same dose of methimazole , 5 mg daily.  Lela Fendt, MD PhD [facility]

## 2024-05-11 ENCOUNTER — Encounter: Payer: Self-pay | Admitting: Internal Medicine

## 2024-05-11 ENCOUNTER — Ambulatory Visit: Admitting: Internal Medicine

## 2024-05-11 ENCOUNTER — Other Ambulatory Visit

## 2024-05-11 VITALS — BP 118/60 | HR 100 | Ht 61.5 in | Wt 163.6 lb

## 2024-05-11 DIAGNOSIS — E041 Nontoxic single thyroid nodule: Secondary | ICD-10-CM

## 2024-05-11 DIAGNOSIS — Z7984 Long term (current) use of oral hypoglycemic drugs: Secondary | ICD-10-CM

## 2024-05-11 DIAGNOSIS — E0590 Thyrotoxicosis, unspecified without thyrotoxic crisis or storm: Secondary | ICD-10-CM

## 2024-05-11 DIAGNOSIS — E119 Type 2 diabetes mellitus without complications: Secondary | ICD-10-CM

## 2024-05-11 LAB — POCT GLYCOSYLATED HEMOGLOBIN (HGB A1C): Hemoglobin A1C: 6.7 % — AB (ref 4.0–5.6)

## 2024-05-11 NOTE — Patient Instructions (Addendum)
 Please continue: - Metformin  ER-Sitagliptin 1000-50 mg 2x a daily - Jardiance  10 mg before breakfast   Start checking sugars 1x day.  Also, continue: - Methimazole  5 mg daily  Check if [facility] takes [employer] station[employer] - [phone].  Please return for another visit in 4-6 months.

## 2024-05-11 NOTE — Addendum Note (Signed)
 Addended by: CLEOTILDE ROLIN RAMAN on: 05/11/2024 01:59 PM   Modules accepted: Orders

## 2024-05-12 LAB — COMPREHENSIVE METABOLIC PANEL WITH GFR
AG Ratio: 1.7 (calc) (ref 1.0–2.5)
ALT: 10 U/L (ref 6–29)
AST: 17 U/L (ref 10–35)
Albumin: 4.6 g/dL (ref 3.6–5.1)
Alkaline phosphatase (APISO): 57 U/L (ref 37–153)
BUN/Creatinine Ratio: 8 (calc) (ref 6–22)
BUN: 6 mg/dL — ABNORMAL LOW (ref 7–25)
CO2: 29 mmol/L (ref 20–32)
Calcium: 10 mg/dL (ref 8.6–10.4)
Chloride: 102 mmol/L (ref 98–110)
Creat: 0.78 mg/dL (ref 0.50–1.05)
Globulin: 2.7 g/dL (ref 1.9–3.7)
Glucose, Bld: 100 mg/dL — ABNORMAL HIGH (ref 65–99)
Potassium: 3.6 mmol/L (ref 3.5–5.3)
Sodium: 141 mmol/L (ref 135–146)
Total Bilirubin: 0.7 mg/dL (ref 0.2–1.2)
Total Protein: 7.3 g/dL (ref 6.1–8.1)
eGFR: 86 mL/min/1.73m2 (ref >=60)

## 2024-05-12 LAB — T3, FREE: T3, Free: 2.7 pg/mL (ref 2.3–4.2)

## 2024-05-12 LAB — TSH: TSH: 2.09 m[IU]/L (ref 0.40–4.50)

## 2024-05-12 LAB — T4, FREE: Free T4: 1.2 ng/dL (ref 0.8–1.8)

## 2024-05-14 ENCOUNTER — Ambulatory Visit: Payer: Self-pay | Admitting: Internal Medicine

## 2024-05-14 MED ORDER — METHIMAZOLE 5 MG PO TABS: 5.0000 mg | ORAL_TABLET | Freq: Every day | ORAL | 3 refills | Status: AC

## 2024-05-14 NOTE — Addendum Note (Signed)
 Addended by: TRIXIE FILE on: 05/14/2024 01:25 PM   Modules accepted: Orders

## 2024-10-11 ENCOUNTER — Ambulatory Visit: Admitting: Internal Medicine

## 2025-01-15 ENCOUNTER — Ambulatory Visit: Admitting: Obstetrics and Gynecology
